# Patient Record
Sex: Male | Born: 1935 | Race: Black or African American | Hispanic: No | Marital: Married | Smoking: Never smoker
Health system: Southern US, Community
[De-identification: ages and names within clinical notes are randomized; demographics above are authoritative.]

## PROBLEM LIST (undated history)

## (undated) DIAGNOSIS — J189 Pneumonia, unspecified organism: Secondary | ICD-10-CM

## (undated) DIAGNOSIS — I318 Other specified diseases of pericardium: Secondary | ICD-10-CM

## (undated) DIAGNOSIS — N451 Epididymitis: Secondary | ICD-10-CM

## (undated) DIAGNOSIS — K08109 Complete loss of teeth, unspecified cause, unspecified class: Secondary | ICD-10-CM

## (undated) DIAGNOSIS — I4891 Unspecified atrial fibrillation: Secondary | ICD-10-CM

## (undated) DIAGNOSIS — E785 Hyperlipidemia, unspecified: Secondary | ICD-10-CM

## (undated) DIAGNOSIS — R251 Tremor, unspecified: Secondary | ICD-10-CM

## (undated) DIAGNOSIS — Z7982 Long term (current) use of aspirin: Secondary | ICD-10-CM

## (undated) DIAGNOSIS — E0500 Thyrotoxicosis with diffuse goiter without thyrotoxic crisis or storm: Secondary | ICD-10-CM

## (undated) DIAGNOSIS — F0280 Dementia in other diseases classified elsewhere without behavioral disturbance: Secondary | ICD-10-CM

## (undated) DIAGNOSIS — I700 Atherosclerosis of aorta: Secondary | ICD-10-CM

## (undated) DIAGNOSIS — N529 Male erectile dysfunction, unspecified: Secondary | ICD-10-CM

## (undated) DIAGNOSIS — I2510 Atherosclerotic heart disease of native coronary artery without angina pectoris: Secondary | ICD-10-CM

## (undated) DIAGNOSIS — E890 Postprocedural hypothyroidism: Secondary | ICD-10-CM

## (undated) DIAGNOSIS — R001 Bradycardia, unspecified: Secondary | ICD-10-CM

## (undated) DIAGNOSIS — R351 Nocturia: Secondary | ICD-10-CM

## (undated) DIAGNOSIS — E43 Unspecified severe protein-calorie malnutrition: Secondary | ICD-10-CM

## (undated) DIAGNOSIS — Z8611 Personal history of tuberculosis: Secondary | ICD-10-CM

## (undated) DIAGNOSIS — I10 Essential (primary) hypertension: Secondary | ICD-10-CM

## (undated) DIAGNOSIS — D61818 Other pancytopenia: Secondary | ICD-10-CM

## (undated) DIAGNOSIS — R600 Localized edema: Secondary | ICD-10-CM

## (undated) DIAGNOSIS — N179 Acute kidney failure, unspecified: Secondary | ICD-10-CM

## (undated) DIAGNOSIS — N400 Enlarged prostate without lower urinary tract symptoms: Secondary | ICD-10-CM

## (undated) DIAGNOSIS — F0390 Unspecified dementia without behavioral disturbance: Secondary | ICD-10-CM

## (undated) DIAGNOSIS — R310 Gross hematuria: Secondary | ICD-10-CM

## (undated) DIAGNOSIS — R972 Elevated prostate specific antigen [PSA]: Secondary | ICD-10-CM

## (undated) DIAGNOSIS — R634 Abnormal weight loss: Secondary | ICD-10-CM

## (undated) DIAGNOSIS — R296 Repeated falls: Secondary | ICD-10-CM

## (undated) DIAGNOSIS — H269 Unspecified cataract: Secondary | ICD-10-CM

## (undated) DIAGNOSIS — H9190 Unspecified hearing loss, unspecified ear: Secondary | ICD-10-CM

## (undated) HISTORY — PX: TOTAL THYROIDECTOMY: SHX2547

## (undated) HISTORY — DX: Benign prostatic hyperplasia without lower urinary tract symptoms: N40.0

## (undated) HISTORY — DX: Epididymitis: N45.1

## (undated) HISTORY — DX: Elevated prostate specific antigen (PSA): R97.20

## (undated) HISTORY — DX: Thyrotoxicosis with diffuse goiter without thyrotoxic crisis or storm: E05.00

## (undated) HISTORY — DX: Essential (primary) hypertension: I10

## (undated) HISTORY — DX: Unspecified dementia, unspecified severity, without behavioral disturbance, psychotic disturbance, mood disturbance, and anxiety: F03.90

## (undated) HISTORY — PX: EYE SURGERY: SHX253

## (undated) HISTORY — DX: Abnormal weight loss: R63.4

## (undated) HISTORY — DX: Atherosclerotic heart disease of native coronary artery without angina pectoris: I25.10

## (undated) HISTORY — DX: Nocturia: R35.1

## (undated) HISTORY — PX: COLONOSCOPY: SHX174

## (undated) HISTORY — DX: Unspecified atrial fibrillation: I48.91

## (undated) HISTORY — DX: Acute kidney failure, unspecified: N17.9

## (undated) HISTORY — DX: Postprocedural hypothyroidism: E89.0

---

## 2006-09-05 ENCOUNTER — Other Ambulatory Visit: Payer: Self-pay

## 2006-09-05 ENCOUNTER — Inpatient Hospital Stay: Payer: Self-pay | Admitting: Internal Medicine

## 2006-10-04 ENCOUNTER — Emergency Department: Payer: Self-pay | Admitting: Emergency Medicine

## 2006-10-04 ENCOUNTER — Other Ambulatory Visit: Payer: Self-pay

## 2006-11-05 ENCOUNTER — Ambulatory Visit: Payer: Self-pay | Admitting: Internal Medicine

## 2007-09-17 ENCOUNTER — Ambulatory Visit: Payer: Self-pay | Admitting: Family Medicine

## 2011-01-30 ENCOUNTER — Inpatient Hospital Stay: Payer: Self-pay | Admitting: Internal Medicine

## 2011-10-08 ENCOUNTER — Ambulatory Visit: Payer: Self-pay | Admitting: Ophthalmology

## 2011-10-08 LAB — CREATININE, SERUM
EGFR (African American): >60
EGFR (Non-African Amer.): >60

## 2011-10-21 DIAGNOSIS — H2510 Age-related nuclear cataract, unspecified eye: Secondary | ICD-10-CM | POA: Insufficient documentation

## 2011-10-21 DIAGNOSIS — E0500 Thyrotoxicosis with diffuse goiter without thyrotoxic crisis or storm: Secondary | ICD-10-CM | POA: Insufficient documentation

## 2011-10-21 DIAGNOSIS — H16109 Unspecified superficial keratitis, unspecified eye: Secondary | ICD-10-CM | POA: Insufficient documentation

## 2011-10-26 ENCOUNTER — Ambulatory Visit: Payer: Self-pay

## 2012-01-02 ENCOUNTER — Ambulatory Visit: Payer: Self-pay | Admitting: Ophthalmology

## 2012-01-02 LAB — CREATININE, SERUM
Creatinine: 1.23 mg/dL (ref 0.60–1.30)
EGFR (African American): >60

## 2012-05-20 ENCOUNTER — Ambulatory Visit: Payer: Self-pay | Admitting: Ophthalmology

## 2012-06-05 ENCOUNTER — Ambulatory Visit: Payer: Self-pay | Admitting: Ophthalmology

## 2012-09-23 ENCOUNTER — Ambulatory Visit: Payer: Self-pay | Admitting: Surgery

## 2012-09-23 LAB — BASIC METABOLIC PANEL
Anion Gap: 4 — ABNORMAL LOW (ref 7–16)
Calcium, Total: 8.7 mg/dL (ref 8.5–10.1)
Chloride: 110 mmol/L — ABNORMAL HIGH (ref 98–107)
EGFR (African American): >60
Glucose: 91 mg/dL (ref 65–99)
Osmolality: 284 (ref 275–301)
Potassium: 4.5 mmol/L (ref 3.5–5.1)
Sodium: 143 mmol/L (ref 136–145)

## 2012-09-25 ENCOUNTER — Ambulatory Visit: Payer: Self-pay | Admitting: Surgery

## 2012-09-26 LAB — CALCIUM: Calcium, Total: 8.7 mg/dL (ref 8.5–10.1)

## 2012-09-26 LAB — ALBUMIN: Albumin: 3.2 g/dL — ABNORMAL LOW (ref 3.4–5.0)

## 2012-09-27 ENCOUNTER — Emergency Department: Payer: Self-pay | Admitting: Internal Medicine

## 2012-09-27 LAB — URINALYSIS, COMPLETE
Bilirubin,UR: NEGATIVE
Ph: 5 (ref 4.5–8.0)
Protein: NEGATIVE
RBC,UR: 36 /HPF (ref 0–5)
Specific Gravity: 1.016 (ref 1.003–1.030)
Squamous Epithelial: 1

## 2012-09-29 LAB — PATHOLOGY REPORT

## 2012-10-07 ENCOUNTER — Ambulatory Visit: Payer: Self-pay | Admitting: Ophthalmology

## 2013-07-06 ENCOUNTER — Ambulatory Visit: Payer: Self-pay | Admitting: Unknown Physician Specialty

## 2013-07-07 LAB — PATHOLOGY REPORT

## 2014-05-03 DIAGNOSIS — I10 Essential (primary) hypertension: Secondary | ICD-10-CM | POA: Insufficient documentation

## 2014-05-03 DIAGNOSIS — I25118 Atherosclerotic heart disease of native coronary artery with other forms of angina pectoris: Secondary | ICD-10-CM | POA: Insufficient documentation

## 2014-05-03 DIAGNOSIS — E785 Hyperlipidemia, unspecified: Secondary | ICD-10-CM | POA: Insufficient documentation

## 2014-05-03 DIAGNOSIS — I2510 Atherosclerotic heart disease of native coronary artery without angina pectoris: Secondary | ICD-10-CM | POA: Insufficient documentation

## 2014-05-19 DIAGNOSIS — E0500 Thyrotoxicosis with diffuse goiter without thyrotoxic crisis or storm: Secondary | ICD-10-CM | POA: Insufficient documentation

## 2014-05-19 DIAGNOSIS — Z8639 Personal history of other endocrine, nutritional and metabolic disease: Secondary | ICD-10-CM | POA: Insufficient documentation

## 2014-05-19 DIAGNOSIS — E079 Disorder of thyroid, unspecified: Secondary | ICD-10-CM | POA: Insufficient documentation

## 2014-11-05 DIAGNOSIS — I10 Essential (primary) hypertension: Secondary | ICD-10-CM | POA: Insufficient documentation

## 2014-11-05 DIAGNOSIS — I2510 Atherosclerotic heart disease of native coronary artery without angina pectoris: Secondary | ICD-10-CM | POA: Insufficient documentation

## 2014-11-05 DIAGNOSIS — E0500 Thyrotoxicosis with diffuse goiter without thyrotoxic crisis or storm: Secondary | ICD-10-CM | POA: Insufficient documentation

## 2014-11-05 DIAGNOSIS — N451 Epididymitis: Secondary | ICD-10-CM | POA: Insufficient documentation

## 2014-11-05 DIAGNOSIS — N179 Acute kidney failure, unspecified: Secondary | ICD-10-CM | POA: Insufficient documentation

## 2014-11-05 DIAGNOSIS — R634 Abnormal weight loss: Secondary | ICD-10-CM | POA: Insufficient documentation

## 2014-11-05 DIAGNOSIS — I4891 Unspecified atrial fibrillation: Secondary | ICD-10-CM | POA: Insufficient documentation

## 2014-11-05 DIAGNOSIS — R972 Elevated prostate specific antigen [PSA]: Secondary | ICD-10-CM | POA: Insufficient documentation

## 2014-11-05 DIAGNOSIS — N400 Enlarged prostate without lower urinary tract symptoms: Secondary | ICD-10-CM | POA: Insufficient documentation

## 2014-11-05 DIAGNOSIS — I480 Paroxysmal atrial fibrillation: Secondary | ICD-10-CM | POA: Insufficient documentation

## 2014-11-05 DIAGNOSIS — E890 Postprocedural hypothyroidism: Secondary | ICD-10-CM | POA: Insufficient documentation

## 2014-11-05 DIAGNOSIS — R351 Nocturia: Secondary | ICD-10-CM | POA: Insufficient documentation
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
## 2014-12-19 ENCOUNTER — Encounter: Payer: Self-pay | Admitting: Urology

## 2014-12-19 DIAGNOSIS — E0500 Thyrotoxicosis with diffuse goiter without thyrotoxic crisis or storm: Secondary | ICD-10-CM | POA: Insufficient documentation

## 2014-12-19 DIAGNOSIS — R351 Nocturia: Secondary | ICD-10-CM

## 2014-12-19 DIAGNOSIS — N400 Enlarged prostate without lower urinary tract symptoms: Secondary | ICD-10-CM

## 2014-12-19 HISTORY — DX: Benign prostatic hyperplasia without lower urinary tract symptoms: N40.0

## 2014-12-19 HISTORY — DX: Nocturia: R35.1

## 2015-01-03 ENCOUNTER — Other Ambulatory Visit: Payer: Self-pay | Admitting: Urology

## 2015-01-03 DIAGNOSIS — R972 Elevated prostate specific antigen [PSA]: Secondary | ICD-10-CM

## 2015-01-04 ENCOUNTER — Ambulatory Visit: Payer: Self-pay | Admitting: Urology

## 2015-01-05 ENCOUNTER — Telehealth: Payer: Self-pay | Admitting: Urology

## 2015-01-05 NOTE — Telephone Encounter (Signed)
Please call the patient to r/s his missed appointment.

## 2015-01-17 ENCOUNTER — Encounter: Payer: Self-pay | Admitting: Family Medicine

## 2015-01-17 NOTE — Telephone Encounter (Signed)
Letter Sent.

## 2015-01-18 ENCOUNTER — Ambulatory Visit: Payer: Self-pay | Admitting: Urology

## 2015-02-09 ENCOUNTER — Ambulatory Visit (INDEPENDENT_AMBULATORY_CARE_PROVIDER_SITE_OTHER): Payer: Self-pay | Admitting: Urology

## 2015-02-09 ENCOUNTER — Encounter: Payer: Self-pay | Admitting: Urology

## 2015-02-09 VITALS — BP 144/75 | HR 56 | Resp 18 | Ht 71.0 in | Wt 199.9 lb

## 2015-02-09 DIAGNOSIS — N528 Other male erectile dysfunction: Secondary | ICD-10-CM

## 2015-02-09 DIAGNOSIS — N401 Enlarged prostate with lower urinary tract symptoms: Secondary | ICD-10-CM

## 2015-02-09 DIAGNOSIS — N138 Other obstructive and reflux uropathy: Secondary | ICD-10-CM

## 2015-02-09 DIAGNOSIS — N529 Male erectile dysfunction, unspecified: Secondary | ICD-10-CM | POA: Insufficient documentation

## 2015-02-09 DIAGNOSIS — N400 Enlarged prostate without lower urinary tract symptoms: Secondary | ICD-10-CM | POA: Insufficient documentation

## 2015-02-09 LAB — BLADDER SCAN AMB NON-IMAGING

## 2015-02-09 MED ORDER — TADALAFIL 20 MG PO TABS: 20.0000 mg | ORAL_TABLET | Freq: Every day | ORAL | Status: DC | PRN

## 2015-02-09 NOTE — Progress Notes (Signed)
02/09/2015 11:07 AM   Tyrone Keith [birth year] [id number]  Referring provider: No referring provider defined for this encounter.  Chief Complaint  Patient presents with  . Follow-up  . Elevated PSA  . Benign Prostatic Hypertrophy    HPI: Tyrone Keith is a 81 year old African American who presents today for a 6 months follow up.  He has massive BPH and his last PSA was 7.59 on 06/23/2014.  This result was obtained by his PCP's office.  For the exception of being enlarged, the prostate exam is normal.  Patient's IPSS score today is 3/3, which is mild symptomatology. He is mixed about his urinary symptoms due to erectile dysfunction.  His PVR today is 39 mL.  He denies any gross hematuria, dysuria or suprapubic pain. He also denies any fevers, chills, nausea or vomiting.      IPSS      02/09/15 1000       International Prostate Symptom Score   How often have you had the sensation of not emptying your bladder? Not at All     How often have you had to urinate less than every two hours? Not at All     How often have you found you stopped and started again several times when you urinated? Not at All     How often have you found it difficult to postpone urination? Less than half the time     How often have you had a weak urinary stream? Not at All     How often have you had to strain to start urination? Not at All     How many times did you typically get up at night to urinate? 1 Time     Total IPSS Score 3     Quality of Life due to urinary symptoms   If you were to spend the rest of your life with your urinary condition just the way it is now how would you feel about that? Mixed        Score:  1-7 Mild 8-19 Moderate 20-35 Severe  Patient states he is able to achieve an erection but it does not last for a significant amount of time. This is preventing him from having satisfactory intercourse. He tried Viagra about 10 years ago and he stated it was ineffective.  He denies any  painful erections or curvature with erections.  PMH: Past Medical History  Diagnosis Date  . Acute kidney failure   . Hypertension   . Epididymitis   . Postoperative primary hypothyroidism   . CAD (coronary artery disease)   . Graves disease   . Atrial fibrillation   . Abnormal weight loss   . Graves disease   . Graves disease   . Elevated PSA   . BPH (benign prostatic hyperplasia) 12/19/2014  . Nocturia 12/19/2014    Surgical History: Past Surgical History  Procedure Laterality Date  . Total thyroidectomy      Home Medications:    Medication List       This list is accurate as of: 02/09/15 11:07 AM.  Always use your most recent med list.               atorvastatin 10 MG tablet  Commonly known as:  LIPITOR  Take by mouth.     carvedilol 3.125 MG tablet  Commonly known as:  COREG  Take by mouth.     levothyroxine 100 MCG tablet  Commonly known as:  SYNTHROID, LEVOTHROID  Take by mouth.     tadalafil 20 MG tablet  Commonly known as:  CIALIS  Take 1 tablet (20 mg total) by mouth daily as needed for erectile dysfunction.        Allergies: No Known Allergies  Family History: Family History  Problem Relation Age of Onset  . Prostate cancer Brother     Social History:  reports that he has never smoked. He does not have any smokeless tobacco history on file. He reports that he does not drink alcohol. His drug history is not on file.  ROS: Urological Symptom Review  Patient is experiencing the following symptoms: Hard to postpone urination Get up at night to urinate   Review of Systems  Gastrointestinal (upper)  : Negative for upper GI symptoms  Gastrointestinal (lower) : Negative for lower GI symptoms  Constitutional : Negative for symptoms  Skin: Negative for skin symptoms  Eyes: Negative for eye symptoms  Ear/Nose/Throat : Negative for Ear/Nose/Throat symptoms  Hematologic/Lymphatic: Negative for Hematologic/Lymphatic  symptoms  Cardiovascular : Negative for cardiovascular symptoms  Respiratory : Negative for respiratory symptoms  Endocrine: Negative for endocrine symptoms  Musculoskeletal: Negative for musculoskeletal symptoms  Neurological: Negative for neurological symptoms  Psychologic: Negative for psychiatric symptoms   Physical Exam: BP 144/75 mmHg  Pulse 56  Resp 18  Ht 5\' 11"  (1.803 m)  Wt 199 lb 14.4 oz (90.674 kg)  BMI 27.89 kg/m2  GU: Patient with uncircumcised phallus. Foreskin easily retracted.  Urethral meatus is patent.  No penile discharge. No penile lesions or rashes. Scrotum without lesions, cysts, rashes and/or edema.  Testicles are located scrotally bilaterally. No masses are appreciated in the testicles. Left and right epididymis are normal.  Patient described what sounds like a communicating hydrocele in the right scrotum. I did not appreciate a hydrocele on today's exam. Rectal: Patient with  normal sphincter tone. Perineum without scarring or rashes. No rectal masses are appreciated. Prostate is approximately 70 grams, no nodules are appreciated. Seminal vesicles are normal.   Laboratory Data: Results for orders placed or performed in visit on 02/09/15  BLADDER SCAN AMB NON-IMAGING  Result Value Ref Range   Scan Result 39ml    No results found for: WBC, HGB, HCT, MCV, PLT  Lab Results  Component Value Date   CREATININE 1.00 09/23/2012    No results found for: PSA  No results found for: TESTOSTERONE  No results found for: HGBA1C  Urinalysis No results found for: COLORURINE, APPEARANCEUR, LABSPEC, PHURINE, GLUCOSEU, HGBUR, BILIRUBINUR, KETONESUR, PROTEINUR, UROBILINOGEN, NITRITE, LEUKOCYTESUR  Pertinent Imaging:   Assessment & Plan:    1. BPH (benign prostatic hyperplasia) with LUTS:   Patient's IPSS score is 3/3. His PVR is 39 mL. He is satisfied with his urinary symptoms.  He will follow-up in one year for IPSS score and PVR.  2. Erectile  dysfunction:   Patient is given a coupon for Cialis.  Cialis 20 mg on-demand dosing is sent to his pharmacy.  He is instructed to take the medication 2 hours prior to intercourse.  He is warned not to take the Cialis with medications that contain nitrates.  I also advised him of the side effects, such as: headache, flushing, dyspepsia, abnormal vision, nasal congestion, back pain, myalgia, nausea, dizziness, and rash. He will report back to the office concerning the results.  3. Elevated PSA:   AUA guidelines discourage PSA screening an individual 11 years of age and older. His last PSA was 7.59 ng/mL on 06/23/2014. The  AUA also recommends increasing the threshold for biopsies in this age group to a PSA of 10. We will continue to monitor the patient with DRE's. If the prostate should present with a nodule, we will discuss a biopsy at that time.  He will present in one year for a DRE.     Return in about 1 year (around 02/09/2016) for IPSS, DRE and PVR.  Michiel CowboySHANNON Brenae Lasecki, PA-C  [facility] [address], [address] [place], [place] [address] [phone]

## 2015-02-22 ENCOUNTER — Emergency Department
Admission: EM | Admit: 2015-02-22 | Discharge: 2015-02-22 | Disposition: A | Discharge status: Home / Self Care | Payer: Medicare Other | Attending: Emergency Medicine | Admitting: Emergency Medicine

## 2015-02-22 ENCOUNTER — Other Ambulatory Visit: Payer: Self-pay

## 2015-02-22 ENCOUNTER — Emergency Department: Payer: Medicare Other

## 2015-02-22 DIAGNOSIS — I10 Essential (primary) hypertension: Secondary | ICD-10-CM | POA: Diagnosis not present

## 2015-02-22 DIAGNOSIS — R51 Headache: Secondary | ICD-10-CM | POA: Insufficient documentation

## 2015-02-22 DIAGNOSIS — R42 Dizziness and giddiness: Secondary | ICD-10-CM | POA: Insufficient documentation

## 2015-02-22 LAB — CBC WITH DIFFERENTIAL/PLATELET
BASOS ABS: 0 10*3/uL (ref 0–0.1)
Basophils Relative: 1 %
EOS PCT: 2 %
Eosinophils Absolute: 0.1 10*3/uL (ref 0–0.7)
HCT: 43.2 % (ref 40.0–52.0)
Hemoglobin: 14.1 g/dL (ref 13.0–18.0)
LYMPHS PCT: 23 %
Lymphs Abs: 1 10*3/uL (ref 1.0–3.6)
MCH: 30.9 pg (ref 26.0–34.0)
MCHC: 32.7 g/dL (ref 32.0–36.0)
MCV: 94.3 fL (ref 80.0–100.0)
MONOS PCT: 9 %
Monocytes Absolute: 0.4 10*3/uL (ref 0.2–1.0)
NEUTROS ABS: 2.9 10*3/uL (ref 1.4–6.5)
NEUTROS PCT: 65 %
Platelets: 149 10*3/uL — ABNORMAL LOW (ref 150–440)
RBC: 4.58 MIL/uL (ref 4.40–5.90)
RDW: 13.9 % (ref 11.5–14.5)
WBC: 4.4 10*3/uL (ref 3.8–10.6)

## 2015-02-22 LAB — COMPREHENSIVE METABOLIC PANEL
ALT: 10 U/L — AB (ref 17–63)
AST: 18 U/L (ref 15–41)
Albumin: 3.6 g/dL (ref 3.5–5.0)
Alkaline Phosphatase: 58 U/L (ref 38–126)
Anion gap: 6 (ref 5–15)
BILIRUBIN TOTAL: 0.8 mg/dL (ref 0.3–1.2)
BUN: 12 mg/dL (ref 6–20)
CO2: 28 mmol/L (ref 22–32)
CREATININE: 1.12 mg/dL (ref 0.61–1.24)
Calcium: 8.8 mg/dL — ABNORMAL LOW (ref 8.9–10.3)
Chloride: 110 mmol/L (ref 101–111)
GFR calc Af Amer: >60 mL/min (ref >60)
Glucose, Bld: 105 mg/dL — ABNORMAL HIGH (ref 65–99)
Potassium: 4.4 mmol/L (ref 3.5–5.1)
Sodium: 144 mmol/L (ref 135–145)
Total Protein: 7 g/dL (ref 6.5–8.1)

## 2015-02-22 LAB — TROPONIN I: Troponin I: <0.03 ng/mL (ref <0.031)

## 2015-02-22 NOTE — Discharge Instructions (Signed)

## 2015-02-22 NOTE — ED Notes (Signed)
Pt c/o HA with nausea and intermittent dizziness(with movement) since yesterday evening..states he took some pain medicine last night but has not had any relief.the patient is not on any blood thinners.Marland Kitchen.denies injury..Marland Kitchen

## 2015-02-22 NOTE — ED Provider Notes (Signed)
[facility] Emergency Department Provider Note     Time seen: ----------------------------------------- 2:58 PM on 02/22/2015 -----------------------------------------    I have reviewed the triage vital signs and the nursing notes.   HISTORY  Chief Complaint Headache; Nausea; and Dizziness    HPI Tyrone Keith is a 81 y.o. male who presents ER for headache and nausea with intermittent dizziness with movement since chest today evening. Wife states he only eats junk food, patient really does not describe his symptoms any more thoroughly than this. He really doesn't complain of any pain currently, he states he generally doesn't feel well with a mild headache that shoots across the frontal part of his head.   Past Medical History  Diagnosis Date  . Acute kidney failure   . Hypertension   . Epididymitis   . Postoperative primary hypothyroidism   . CAD (coronary artery disease)   . Graves disease   . Atrial fibrillation   . Abnormal weight loss   . Graves disease   . Graves disease   . Elevated PSA   . BPH (benign prostatic hyperplasia) 12/19/2014  . Nocturia 12/19/2014    Patient Active Problem List   Diagnosis Date Noted  . 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 BPH (benign prostatic hyperplasia) 02/09/2015  . Graves disease   . Abnormal weight loss 11/05/2014  . Acute kidney failure 11/05/2014  . A-fib 11/05/2014  . Benign fibroma of prostate 11/05/2014  . Arteriosclerosis of coronary artery 11/05/2014  . Abnormal prostate specific antigen 11/05/2014  . Epididymitis 11/05/2014  . 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 BP (high blood pressure) 11/05/2014  . Excessive urination at night 11/05/2014  . Hypothyroidism, postop 11/05/2014  . 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 CAD in native artery 05/03/2014  . Benign essential HTN 05/03/2014  . Cataract, nuclear sclerotic senile 10/21/2011  . Keratitis, superficial 10/21/2011   . 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 Total thyroidectomy    . Eye surgery      Allergies Review of patient's allergies indicates no known allergies.  Social History History  Substance Use Topics  . Smoking status: Never Smoker   . Smokeless tobacco: Never Used  . Alcohol Use: No    Review of Systems Constitutional: Negative for fever. Eyes: Negative for visual changes. ENT: Negative for sore throat. Cardiovascular: Negative for chest pain. Respiratory: Negative for shortness of breath. Gastrointestinal: Negative for abdominal pain, vomiting and diarrhea. Genitourinary: Negative for dysuria. Musculoskeletal: Negative for back pain. Skin: Negative for rash. Neurological: Positive for headache, dizziness  10-point ROS otherwise negative.  ____________________________________________   PHYSICAL EXAM:  VITAL SIGNS: ED Triage Vitals  Enc Vitals Group     BP 02/22/15 1412 140/67 mmHg     Pulse Rate 02/22/15 1412 64     Resp 02/22/15 1412 18     Temp 02/22/15 1412 97.7 F (36.5 C)     Temp Source 02/22/15 1412 Oral     SpO2 02/22/15 1412 96 %     Weight 02/22/15 1412 200 lb (90.719 kg)     Height 02/22/15 1412  (1.803 m)     Head Cir --      Peak Flow --      Pain Score 02/22/15 1413 0     Pain Loc --      Pain Edu? --      Excl. in GC? --     Constitutional: Alert and  oriented. Well appearing and in no distress. Eyes: Conjunctivae are normal. PERRL. Normal extraocular movements. ENT   Head: Normocephalic and atraumatic.   Nose: No congestion/rhinnorhea.   Mouth/Throat: Mucous membranes are moist.   Neck: No stridor. Hematological/Lymphatic/Immunilogical: No cervical lymphadenopathy. Cardiovascular: Normal rate, regular rhythm. Normal and symmetric distal pulses are present in all extremities. No murmurs, rubs, or gallops. Respiratory: Normal respiratory effort without tachypnea nor  retractions. Breath sounds are clear and equal bilaterally. No wheezes/rales/rhonchi. Gastrointestinal: Soft and nontender. No distention. No abdominal bruits. There is no CVA tenderness. Musculoskeletal: Nontender with normal range of motion in all extremities. No joint effusions.  No lower extremity tenderness nor edema. Neurologic:  Normal speech and language. No gross focal neurologic deficits are appreciated. Speech is normal. No gait instability. Skin:  Skin is warm, dry and intact. No rash noted. Psychiatric: Mood and affect are normal. Speech and behavior are normal. Patient exhibits appropriate insight and judgment.  ____________________________________________  ED COURSE:  Pertinent labs & imaging results that were available during my care of the patient were reviewed by me and considered in my medical decision making (see chart for details).  We'll check basic labs, CT head and reevaluate. ____________________________________________    LABS (pertinent positives/negatives)  Labs Reviewed  CBC WITH DIFFERENTIAL/PLATELET - Abnormal; Notable for the following:    Platelets 149 (*)    All other components within normal limits  COMPREHENSIVE METABOLIC PANEL - Abnormal; Notable for the following:    Glucose, Bld 105 (*)    Calcium 8.8 (*)    ALT 10 (*)    All other components within normal limits  TROPONIN I  URINALYSIS COMPLETEWITH MICROSCOPIC ([facility] ONLY)    RADIOLOGY   IMPRESSION: No acute intracranial abnormality. Stable mild cerebral atrophy. Stable periventricular and patchy subcortical chronic white matter disease.  ____________________________________________  FINAL ASSESSMENT AND PLAN  Headache, dizziness  Plan: Patient with labs and imaging as dictated above. Patient has dizziness, no clear etiology. He is encouraged follow-up with his doctor in 2 days for reevaluation.   Emily FilbertWilliams, Jonathan E, MD   Emily FilbertJonathan E Williams, MD 02/22/15 [phone]

## 2016-02-09 ENCOUNTER — Encounter: Payer: Self-pay | Admitting: Urology

## 2016-02-09 ENCOUNTER — Ambulatory Visit: Payer: Medicare Other | Admitting: Urology

## 2016-03-07 ENCOUNTER — Encounter: Payer: Self-pay | Admitting: Urology

## 2016-03-07 ENCOUNTER — Ambulatory Visit (INDEPENDENT_AMBULATORY_CARE_PROVIDER_SITE_OTHER): Payer: Medicare Other | Admitting: Urology

## 2016-03-07 VITALS — BP 125/70 | HR 66 | Ht 71.0 in | Wt 179.8 lb

## 2016-03-07 DIAGNOSIS — N401 Enlarged prostate with lower urinary tract symptoms: Secondary | ICD-10-CM

## 2016-03-07 DIAGNOSIS — R972 Elevated prostate specific antigen [PSA]: Secondary | ICD-10-CM

## 2016-03-07 DIAGNOSIS — N138 Other obstructive and reflux uropathy: Secondary | ICD-10-CM

## 2016-03-07 LAB — BLADDER SCAN AMB NON-IMAGING: Scan Result: 33

## 2016-03-07 NOTE — Progress Notes (Signed)
03/07/2016 4:07 PM   Tyrone Keith 01-Jan-1933 [id number]  Referring provider: Kandyce Rud, MD [phone] Tyrone Keith [facility] [place], [place] [address]  Chief Complaint  Patient presents with  . Follow-up    HPI: [name] is a 82 year old African American who presents today for a 6 months follow up.  He has massive BPH and his last PSA was 7.59 on 06/23/2014.  This result was obtained by his PCP's office.  For the exception of being enlarged, the prostate exam is normal.  Patient's IPSS score today is 4, which is mild symptomatology. He is delighted about his urinary symptoms.  His previous IPSS score was 3/3.  His previous PVR today is 39 mL.  He denies any gross hematuria, dysuria or suprapubic pain. He also denies any fevers, chills, nausea or vomiting.      IPSS    Row Name 03/07/16 1500         International Prostate Symptom Score   How often have you had the sensation of not emptying your bladder? Not at All     How often have you had to urinate less than every two hours? Not at All     How often have you found you stopped and started again several times when you urinated? Less than 1 in 5 times     How often have you found it difficult to postpone urination? Less than 1 in 5 times     How often have you had a weak urinary stream? Less than 1 in 5 times     How often have you had to strain to start urination? Not at All     How many times did you typically get up at night to urinate? 1 Time     Total IPSS Score 4       Quality of Life due to urinary symptoms   If you were to spend the rest of your life with your urinary condition just the way it is now how would you feel about that? Delighted        Score:  1-7 Mild 8-19 Moderate 20-35 Severe   PMH: Past Medical History:  Diagnosis Date  . Abnormal weight loss   . Acute kidney failure (HCC)   . Atrial fibrillation (HCC)   . BPH (benign prostatic hyperplasia) 12/19/2014    . CAD (coronary artery disease)   . Elevated PSA   . Epididymitis   . Graves disease   . Graves disease   . Graves disease   . Hypertension   . Nocturia 12/19/2014  . Postoperative primary hypothyroidism     Surgical History: Past Surgical History:  Procedure Laterality Date  . EYE SURGERY    . TOTAL THYROIDECTOMY      Home Medications:    Medication List       Accurate as of 03/07/16  4:07 PM. Always use your most recent med list.          acetaminophen 325 MG tablet Commonly known as:  TYLENOL Take 975 mg by mouth every 6 (six) hours as needed for mild pain or headache.   atorvastatin 10 MG tablet Commonly known as:  LIPITOR Take 10 mg by mouth daily.   carvedilol 3.125 MG tablet Commonly known as:  COREG Take 3.125 mg by mouth 2 (two) times daily.   levothyroxine 100 MCG tablet Commonly known as:  SYNTHROID, LEVOTHROID Take 100 mcg by mouth daily.  tadalafil 20 MG tablet Commonly known as:  CIALIS Take 1 tablet (20 mg total) by mouth daily as needed for erectile dysfunction.       Allergies: No Known Allergies  Family History: Family History  Problem Relation Age of Onset  . Prostate cancer Brother   . Bladder Cancer Neg Hx     Social History:  reports that he has never smoked. He has never used smokeless tobacco. He reports that he does not drink alcohol or use drugs.  ROS: Urological Symptom Review  Patient is experiencing the following symptoms: Hard to postpone urination Get up at night to urinate   Review of Systems  Gastrointestinal (upper)  : Negative for upper GI symptoms  Gastrointestinal (lower) : Negative for lower GI symptoms  Constitutional : Negative for symptoms  Skin: Negative for skin symptoms  Eyes: Negative for eye symptoms  Ear/Nose/Throat : Negative for Ear/Nose/Throat symptoms  Hematologic/Lymphatic: Negative for Hematologic/Lymphatic symptoms  Cardiovascular : Negative for cardiovascular  symptoms  Respiratory : Negative for respiratory symptoms  Endocrine: Negative for endocrine symptoms  Musculoskeletal: Negative for musculoskeletal symptoms  Neurological: Negative for neurological symptoms  Psychologic: Negative for psychiatric symptoms   Physical Exam: BP 125/70 (BP Location: Left Arm, Patient Position: Sitting, Cuff Size: Normal)   Pulse 66   Ht  (1.803 m)   Wt 179 lb 12.8 oz (81.6 kg)   BMI 25.08 kg/m   GU: Patient with uncircumcised phallus. Foreskin easily retracted.  Urethral meatus is patent.  No penile discharge. No penile lesions or rashes. Scrotum without lesions, cysts, rashes and/or edema.  Testicles are located scrotally bilaterally. No masses are appreciated in the testicles. Left and right epididymis are normal.  Patient described what sounds like a communicating hydrocele in the right scrotum. I did not appreciate a hydrocele on today's exam. Rectal: Patient with  normal sphincter tone. Perineum without scarring or rashes. No rectal masses are appreciated. Prostate is approximately 70 grams, no nodules are appreciated. Seminal vesicles are normal.   Laboratory Data: Results for orders placed or performed in visit on 03/07/16  Bladder Scan (Post Void Residual) in office  Result Value Ref Range   Scan Result 33    Lab Results  Component Value Date   WBC 4.4 02/22/2015   HGB 14.1 02/22/2015   HCT 43.2 02/22/2015   MCV 94.3 02/22/2015   PLT 149 (L) 02/22/2015    Lab Results  Component Value Date   CREATININE 1.12 02/22/2015     Urinalysis  Pertinent Imaging: Results for Tyrone, Keith (MRN [id number]) as of 03/07/2016 15:54  Ref. Range 03/07/2016 15:42  Scan Result Unknown 33    Assessment & Plan:    1. BPH (benign prostatic hyperplasia) with LUTS:   Patient's IPSS score is 4/0.  His PVR is 33 mL. He is satisfied with his urinary symptoms.  He will follow-up in one year for IPSS score and PVR.  2. Erectile dysfunction:    Patient is not interested in sexually activity.  3. Elevated PSA:   AUA guidelines discourage PSA screening an individual 12 years of age and older. His last PSA was 7.59 ng/mL on 06/23/2014. The AUA also recommends increasing the threshold for biopsies in this age group to a PSA of 10. We will continue to monitor the patient with DRE's. If the prostate should present with a nodule, we will discuss a biopsy at that time.  He will present in one year for a DRE.  Return in about 1 year (around 03/07/2017) for IPSS and exam.  Michiel Cowboy, [facility] [address], [address] [place], [place] [address] [phone]

## 2017-03-05 NOTE — Progress Notes (Deleted)
03/06/2017 9:10 PM   Tyrone Keith Bob Aug 19, 1932 [id number]  Referring provider: Kandyce RudBabaoff, Marcus, MD [phone] [address] [facility] [place] [facility] [place], [place] [address]  No chief complaint on file.   HPI: Tyrone Keith is a 83 year old African American male who presents today for a 12 months follow up.    BPH with LU TS Patient's IPSS score today is ***, which is *** symptomatology. He is *** about his urinary symptoms.  His previous IPSS score was 4/0.  His previous PVR today is 39 mL.  His major complaint(s) today is/are ***.   This has been occurring for ***.  He denies any gross hematuria, dysuria or suprapubic pain. He also denies any fevers, chills, nausea or vomiting.    Score:  1-7 Mild 8-19 Moderate 20-35 Severe  History of elevated PSA He has massive BPH and his last PSA was 7.59 on 06/23/2014.  This result was obtained by his PCP's office.  For the exception of being enlarged, the prostate exam is normal.  Erectile dysfunction Not sexually active at this time.   PMH: Past Medical History:  Diagnosis Date  . Abnormal weight loss   . Acute kidney failure (HCC)   . Atrial fibrillation (HCC)   . BPH (benign prostatic hyperplasia) 12/19/2014  . CAD (coronary artery disease)   . Elevated PSA   . Epididymitis   . Graves disease   . Graves disease   . Graves disease   . Hypertension   . Nocturia 12/19/2014  . Postoperative primary hypothyroidism     Surgical History: Past Surgical History:  Procedure Laterality Date  . EYE SURGERY    . TOTAL THYROIDECTOMY      Home Medications:  Allergies as of 03/06/2017   No Known Allergies     Medication List       Accurate as of 03/05/17  9:10 PM. Always use your most recent med list.          acetaminophen 325 MG tablet Commonly known as:  TYLENOL Take 975 mg by mouth every 6 (six) hours as needed for mild pain or headache.   atorvastatin 10 MG tablet Commonly known as:  LIPITOR Take 10  mg by mouth daily.   carvedilol 3.125 MG tablet Commonly known as:  COREG Take 3.125 mg by mouth 2 (two) times daily.   levothyroxine 100 MCG tablet Commonly known as:  SYNTHROID, LEVOTHROID Take 100 mcg by mouth daily.   tadalafil 20 MG tablet Commonly known as:  CIALIS Take 1 tablet (20 mg total) by mouth daily as needed for erectile dysfunction.       Allergies: No Known Allergies  Family History: Family History  Problem Relation Age of Onset  . Prostate cancer Brother   . Bladder Cancer Neg Hx     Social History:  reports that he has never smoked. He has never used smokeless tobacco. He reports that he does not drink alcohol or use drugs.  ROS:       Physical Exam: There were no vitals taken for this visit.  Constitutional: Well nourished. Alert and oriented, No acute distress. HEENT: [place] AT, moist mucus membranes. Trachea midline, no masses. Cardiovascular: No clubbing, cyanosis, or edema. Respiratory: Normal respiratory effort, no increased work of breathing. GI: Abdomen is soft, non tender, non distended, no abdominal masses. Liver and spleen not palpable.  No hernias appreciated.  Stool sample for occult testing is not indicated.   GU: No CVA  tenderness.  No bladder fullness or masses.  Patient with circumcised/uncircumcised phallus. ***Foreskin easily retracted***  Urethral meatus is patent.  No penile discharge. No penile lesions or rashes. Scrotum without lesions, cysts, rashes and/or edema.  Testicles are located scrotally bilaterally. No masses are appreciated in the testicles. Left and right epididymis are normal. Rectal: Patient with  normal sphincter tone. Anus and perineum without scarring or rashes. No rectal masses are appreciated. Prostate is approximately *** grams, *** nodules are appreciated. Seminal vesicles are normal. Skin: No rashes, bruises or suspicious lesions. Lymph: No cervical or inguinal adenopathy. Neurologic: Grossly intact, no focal  deficits, moving all 4 extremities. Psychiatric: Normal mood and affect.  Laboratory Data:  Urinalysis  Pertinent Imaging:    Assessment & Plan:    1. BPH (benign prostatic hyperplasia) with LUTS:   Patient's IPSS score is 4/0.  His PVR is 33 mL. He is satisfied with his urinary symptoms.  He will follow-up in one year for IPSS score and PVR.  2. Erectile dysfunction:   Patient is not interested in sexually activity.  3. Elevated PSA:   AUA guidelines discourage PSA screening an individual 81 years of age and older. His last PSA was 7.59 ng/mL on 06/23/2014. The AUA also recommends increasing the threshold for biopsies in this age group to a PSA of 10. We will continue to monitor the patient with DRE's. If the prostate should present with a nodule, we will discuss a biopsy at that time.  He will present in one year for a DRE.     No Follow-up on file.  Michiel CowboySHANNON Feliciano Wynter, PA-C  [facility] [address], [address] [place], [place] [address] [phone]

## 2017-03-06 ENCOUNTER — Encounter: Payer: Self-pay | Admitting: Urology

## 2017-03-06 ENCOUNTER — Ambulatory Visit: Payer: Medicare Other | Admitting: Urology

## 2017-03-10 ENCOUNTER — Telehealth: Payer: Self-pay | Admitting: Urology

## 2017-03-10 NOTE — Telephone Encounter (Signed)
Patient called and left a message on the voicemail 03-10-17 did not say what he wanted but he did miss his app with Carollee HerterShannon on 03-07-17 and so I left him a message to call back to reschedule it. If he calls back he just needs to reschedule his app.  Thanks [employer]

## 2017-03-23 NOTE — Progress Notes (Signed)
03/24/2017 3:01 PM   Tyrone Keith Jun 02, 1933 [id number]  Referring provider: Kandyce Rud, MD [phone] [address] [facility] - Family and Internal Medicine [place], [place] [address]  Chief Complaint  Patient presents with  . Benign Prostatic Hypertrophy    1 year follow up  . Elevated PSA    HPI: Tyrone Keith is a 83 year old African American male who presents today for a 12 months follow up.    BPH with LU TS Patient's IPSS score today is 3, which is mild symptomatology. He is pleased about his urinary symptoms.  His previous IPSS score was 4/0.  His previous PVR was 39 mL.  His major complaint(s) today is/are nocturia x 1.   This has been occurring for several years.  He denies any gross hematuria, dysuria or suprapubic pain. He also denies any fevers, chills, nausea or vomiting.      IPSS    Row Name 03/24/17 1400         International Prostate Symptom Score   How often have you had the sensation of not emptying your bladder? Less than 1 in 5     How often have you had to urinate less than every two hours? Not at All     How often have you found you stopped and started again several times when you urinated? Not at All     How often have you found it difficult to postpone urination? Less than 1 in 5 times     How often have you had a weak urinary stream? Not at All     How often have you had to strain to start urination? Not at All     How many times did you typically get up at night to urinate? 1 Time     Total IPSS Score 3       Quality of Life due to urinary symptoms   If you were to spend the rest of your life with your urinary condition just the way it is now how would you feel about that? Pleased        Score:  1-7 Mild 8-19 Moderate 20-35 Severe  History of elevated PSA He has massive BPH and his last PSA was 7.59 on 06/23/2014.  This result was obtained by his PCP's office.  For the exception of being enlarged, the prostate exam is normal.  Erectile  dysfunction Not sexually active at this time.   PMH: Past Medical History:  Diagnosis Date  . Abnormal weight loss   . Acute kidney failure (HCC)   . Atrial fibrillation (HCC)   . BPH (benign prostatic hyperplasia) 12/19/2014  . CAD (coronary artery disease)   . Elevated PSA   . Epididymitis   . Graves disease   . Graves disease   . Graves disease   . Hypertension   . Nocturia 12/19/2014  . Postoperative primary hypothyroidism     Surgical History: Past Surgical History:  Procedure Laterality Date  . EYE SURGERY    . TOTAL THYROIDECTOMY      Home Medications:  Allergies as of 03/24/2017   No Known Allergies     Medication List       Accurate as of 03/24/17  3:01 PM. Always use your most recent med list.          acetaminophen 325 MG tablet Commonly known as:  TYLENOL Take 975 mg by mouth every 6 (six) hours as needed for mild pain or headache.  atorvastatin 10 MG tablet Commonly known as:  LIPITOR Take 10 mg by mouth daily.   carvedilol 3.125 MG tablet Commonly known as:  COREG Take 3.125 mg by mouth 2 (two) times daily.   levothyroxine 100 MCG tablet Commonly known as:  SYNTHROID, LEVOTHROID Take 100 mcg by mouth daily.   tadalafil 20 MG tablet Commonly known as:  CIALIS Take 1 tablet (20 mg total) by mouth daily as needed for erectile dysfunction.       Allergies: No Known Allergies  Family History: Family History  Problem Relation Age of Onset  . Prostate cancer Brother   . Bladder Cancer Neg Hx   . Kidney cancer Neg Hx     Social History:  reports that he has never smoked. He has never used smokeless tobacco. He reports that he does not drink alcohol or use drugs.  ROS: UROLOGY Frequent Urination?: No Hard to postpone urination?: No Burning/pain with urination?: No Get up at night to urinate?: No Leakage of urine?: No Urine stream starts and stops?: No Trouble starting stream?: No Do you have to strain to urinate?: No Blood in  urine?: No Urinary tract infection?: No Sexually transmitted disease?: No Injury to kidneys or bladder?: No Painful intercourse?: No Weak stream?: No Erection problems?: No Penile pain?: No Gastrointestinal Nausea?: No Vomiting?: No Indigestion/heartburn?: No Diarrhea?: No Constipation?: No Constitutional Fever: No Night sweats?: No Weight loss?: No Fatigue?: No Skin Skin rash/lesions?: No Itching?: No Eyes Blurred vision?: No Double vision?: No Ears/Nose/Throat Sore throat?: No Sinus problems?: No Hematologic/Lymphatic Swollen glands?: No Easy bruising?: No Cardiovascular Leg swelling?: No Chest pain?: No Respiratory Cough?: No Shortness of breath?: No Endocrine Excessive thirst?: No Musculoskeletal Back pain?: No Joint pain?: No Neurological Headaches?: No Dizziness?: No Psychologic Depression?: No Anxiety?: No     Physical Exam: BP 135/72   Pulse 66   Ht 5\' 10"  (1.778 m)   Wt 177 lb 1.6 oz (80.3 kg)   BMI 25.41 kg/m   Constitutional: Well nourished. Alert and oriented, No acute distress. HEENT: [place] AT, moist mucus membranes. Trachea midline, no masses. Cardiovascular: No clubbing, cyanosis, or edema. Respiratory: Normal respiratory effort, no increased work of breathing. GI: Abdomen is soft, non tender, non distended, no abdominal masses. Liver and spleen not palpable.  No hernias appreciated.  Stool sample for occult testing is not indicated.   GU: No CVA tenderness.  No bladder fullness or masses.  Patient with uncircumcised phallus. Foreskin easily retracted  Urethral meatus is patent.  No penile discharge. No penile lesions or rashes. Scrotum without lesions, cysts, rashes and/or edema.  Testicles are located scrotally bilaterally. No masses are appreciated in the testicles. Left and right epididymis are normal. Rectal: Patient with  normal sphincter tone. Anus and perineum without scarring or rashes. No rectal masses are appreciated. Prostate  is approximately 70 grams, no nodules are appreciated. Seminal vesicles are normal. Skin: No rashes, bruises or suspicious lesions. Lymph: No cervical or inguinal adenopathy. Neurologic: Grossly intact, no focal deficits, moving all 4 extremities. Psychiatric: Normal mood and affect.  Laboratory Data:  Assessment & Plan:    1. BPH (benign prostatic hyperplasia) with LUTS:   Patient's IPSS score is 3/1.  He is pleased with his urinary symptoms.  He will follow-up in one year for IPSS score and PVR.  2. Erectile dysfunction:   Patient is not interested in sexually activity.  3. Elevated PSA:   AUA guidelines discourage PSA screening an individual 39 years of age  and older. His last PSA was 7.59 ng/mL on 06/23/2014. The AUA also recommends increasing the threshold for biopsies in this age group to a PSA of 10. We will continue to monitor the patient with DRE's. If the prostate should present with a nodule, we will discuss a biopsy at that time.  He will present in one year for a DRE.     Return in about 1 week (around 03/31/2017) for I PSS and exam.  Michiel Cowboy, [facility] [address] [place], [place] [address] [phone]

## 2017-03-24 ENCOUNTER — Ambulatory Visit (INDEPENDENT_AMBULATORY_CARE_PROVIDER_SITE_OTHER): Payer: Medicare Other | Admitting: Urology

## 2017-03-24 ENCOUNTER — Encounter: Payer: Self-pay | Admitting: Urology

## 2017-03-24 VITALS — BP 135/72 | HR 66 | Ht 70.0 in | Wt 177.1 lb

## 2017-03-24 DIAGNOSIS — R972 Elevated prostate specific antigen [PSA]: Secondary | ICD-10-CM | POA: Diagnosis not present

## 2017-03-24 DIAGNOSIS — N529 Male erectile dysfunction, unspecified: Secondary | ICD-10-CM | POA: Diagnosis not present

## 2017-03-24 DIAGNOSIS — N401 Enlarged prostate with lower urinary tract symptoms: Secondary | ICD-10-CM | POA: Diagnosis not present

## 2017-03-24 DIAGNOSIS — N138 Other obstructive and reflux uropathy: Secondary | ICD-10-CM

## 2018-03-24 NOTE — Progress Notes (Addendum)
03/25/2018 2:32 PM   Tyrone Keith 12-Apr-1933 [id number]  Referring provider: Kandyce RudBabaoff, Marcus, MD [phone] [address] [facility] [place] [facility] [place], [place] [address]  No chief complaint on file.   HPI: [name] is a 84 year old African American male who presents today for a 12 months follow up.    BPH with LU TS Patient's IPSS score today is 4, which is mild symptomatology.  He is pleased about his urinary symptoms.  His previous IPSS score was 4/0.  His previous PVR was 39 mL.  His major complaint(s) today is/are nocturia x 1.   This has been occurring for several years.  He denies any gross hematuria, dysuria or suprapubic pain. He also denies any fevers, chills, nausea or vomiting. IPSS    Row Name 03/25/18 1400         International Prostate Symptom Score   How often have you had the sensation of not emptying your bladder?  Not at All     How often have you had to urinate less than every two hours?  Less than 1 in 5 times     How often have you found you stopped and started again several times when you urinated?  Less than half the time     How often have you found it difficult to postpone urination?  Not at All     How often have you had a weak urinary stream?  Not at All     How often have you had to strain to start urination?  Not at All     How many times did you typically get up at night to urinate?  1 Time     Total IPSS Score  4       Quality of Life due to urinary symptoms   If you were to spend the rest of your life with your urinary condition just the way it is now how would you feel about that?  Pleased        Score:  1-7 Mild 8-19 Moderate 20-35 Severe   PMH: Past Medical History:  Diagnosis Date  . Abnormal weight loss   . Acute kidney failure (HCC)   . Atrial fibrillation (HCC)   . BPH (benign prostatic hyperplasia) 12/19/2014  . CAD (coronary artery disease)   . Elevated PSA   . Epididymitis   . Graves disease   .  Graves disease   . Graves disease   . Hypertension   . Nocturia 12/19/2014  . Postoperative primary hypothyroidism     Surgical History: Past Surgical History:  Procedure Laterality Date  . EYE SURGERY    . TOTAL THYROIDECTOMY      Home Medications:  Allergies as of 03/25/2018   No Known Allergies     Medication List        Accurate as of 03/25/18  2:32 PM. Always use your most recent med list.          acetaminophen 325 MG tablet Commonly known as:  TYLENOL Take 975 mg by mouth every 6 (six) hours as needed for mild pain or headache.   atorvastatin 10 MG tablet Commonly known as:  LIPITOR Take 10 mg by mouth daily.   carvedilol 3.125 MG tablet Commonly known as:  COREG Take 3.125 mg by mouth 2 (two) times daily.   levothyroxine 100 MCG tablet Commonly known as:  SYNTHROID, LEVOTHROID Take 100 mcg by mouth daily.  Allergies: No Known Allergies  Family History: Family History  Problem Relation Age of Onset  . Prostate cancer Brother   . Bladder Cancer Neg Hx   . Kidney cancer Neg Hx     Social History:  reports that he has never smoked. He has never used smokeless tobacco. He reports that he does not drink alcohol or use drugs.  ROS: UROLOGY Frequent Urination?: Yes Hard to postpone urination?: No Burning/pain with urination?: No Get up at night to urinate?: Yes Leakage of urine?: No Urine stream starts and stops?: No Trouble starting stream?: No Do you have to strain to urinate?: No Blood in urine?: No Urinary tract infection?: No Sexually transmitted disease?: No Injury to kidneys or bladder?: No Painful intercourse?: No Weak stream?: No Erection problems?: Yes Penile pain?: No Gastrointestinal Nausea?: No Vomiting?: No Indigestion/heartburn?: No Diarrhea?: No Constipation?: No Constitutional Fever: No Night sweats?: No Weight loss?: Yes Fatigue?: No Skin Skin rash/lesions?: No Itching?: No Eyes Blurred vision?: No Double  vision?: No Ears/Nose/Throat Sore throat?: No Sinus problems?: No Hematologic/Lymphatic Swollen glands?: No Easy bruising?: No Cardiovascular Leg swelling?: No Chest pain?: No Respiratory Cough?: No Shortness of breath?: No Endocrine Excessive thirst?: No Musculoskeletal Back pain?: No Joint pain?: No Neurological Headaches?: No Dizziness?: No Psychologic Depression?: No Anxiety?: No     Physical Exam: BP (!) 147/72 (BP Location: Left Arm, Patient Position: Sitting, Cuff Size: Normal)   Pulse 69   Ht 5\' 10"  (1.778 m)   Wt 166 lb 9.6 oz (75.6 kg)   BMI 23.90 kg/m   Constitutional: Well nourished. Alert and oriented, No acute distress. HEENT: [place] AT, moist mucus membranes. Trachea midline, no masses. Poor dentition.  Cardiovascular: No clubbing, cyanosis, or edema. Respiratory: Normal respiratory effort, no increased work of breathing. GI: Abdomen is soft, non tender, non distended, no abdominal masses. Liver and spleen not palpable.  No hernias appreciated.  Stool sample for occult testing is not indicated.   GU: No CVA tenderness.  No bladder fullness or masses.  Patient with uncircumcised phallus.  Foreskin easily retracted.   Urethral meatus is patent.  No penile discharge. No penile lesions or rashes. Scrotum without lesions, cysts, rashes and/or edema.  Testicles are located scrotally bilaterally. No masses are appreciated in the testicles. Left and right epididymis are normal. Rectal: Patient with  normal sphincter tone. Anus and perineum without scarring or rashes. No rectal masses are appreciated. Prostate is approximately 70 grams, could only palpated the apex and midportion, no nodules are appreciated. Skin: No rashes, bruises or suspicious lesions. Lymph: No cervical or inguinal adenopathy. Neurologic: Grossly intact, no focal deficits, moving all 4 extremities. Psychiatric: Normal mood and affect.   Laboratory Data:  Assessment & Plan:    1. BPH (benign  prostatic hyperplasia) with LUTS:   Patient's IPSS score is 4/1.  He is pleased with his urinary symptoms.  He will follow-up in one year for IPSS score and PVR.    Return in about 1 year (around 03/26/2019) for I PSS and exam .  Michiel CowboySHANNON Jarelyn Bambach, [facility]-C  [facility] [address] [place], [place] [address] [phone]

## 2018-03-25 ENCOUNTER — Ambulatory Visit (INDEPENDENT_AMBULATORY_CARE_PROVIDER_SITE_OTHER): Payer: Medicare Other | Admitting: Urology

## 2018-03-25 ENCOUNTER — Encounter: Payer: Self-pay | Admitting: Urology

## 2018-03-25 VITALS — BP 147/72 | HR 69 | Ht 70.0 in | Wt 166.6 lb

## 2018-03-25 DIAGNOSIS — N401 Enlarged prostate with lower urinary tract symptoms: Secondary | ICD-10-CM | POA: Diagnosis not present

## 2018-03-25 DIAGNOSIS — N138 Other obstructive and reflux uropathy: Secondary | ICD-10-CM | POA: Diagnosis not present

## 2018-12-11 ENCOUNTER — Emergency Department: Payer: Medicare Other

## 2018-12-11 ENCOUNTER — Other Ambulatory Visit: Payer: Self-pay

## 2018-12-11 ENCOUNTER — Emergency Department
Admission: EM | Admit: 2018-12-11 | Discharge: 2018-12-11 | Disposition: A | Discharge status: Home / Self Care | Payer: Medicare Other | Attending: Emergency Medicine | Admitting: Emergency Medicine

## 2018-12-11 ENCOUNTER — Encounter: Payer: Self-pay | Admitting: Emergency Medicine

## 2018-12-11 DIAGNOSIS — I10 Essential (primary) hypertension: Secondary | ICD-10-CM | POA: Insufficient documentation

## 2018-12-11 DIAGNOSIS — I2510 Atherosclerotic heart disease of native coronary artery without angina pectoris: Secondary | ICD-10-CM | POA: Insufficient documentation

## 2018-12-11 DIAGNOSIS — R310 Gross hematuria: Secondary | ICD-10-CM | POA: Insufficient documentation

## 2018-12-11 DIAGNOSIS — E890 Postprocedural hypothyroidism: Secondary | ICD-10-CM | POA: Insufficient documentation

## 2018-12-11 DIAGNOSIS — Z79899 Other long term (current) drug therapy: Secondary | ICD-10-CM | POA: Diagnosis not present

## 2018-12-11 DIAGNOSIS — R319 Hematuria, unspecified: Secondary | ICD-10-CM | POA: Diagnosis present

## 2018-12-11 LAB — BASIC METABOLIC PANEL
Anion gap: 6 (ref 5–15)
BUN: 16 mg/dL (ref 8–23)
CO2: 29 mmol/L (ref 22–32)
Calcium: 8.5 mg/dL — ABNORMAL LOW (ref 8.9–10.3)
Chloride: 107 mmol/L (ref 98–111)
Creatinine, Ser: 1.09 mg/dL (ref 0.61–1.24)
GFR calc Af Amer: >60 mL/min (ref >60)
GFR calc non Af Amer: >60 mL/min (ref >60)
Glucose, Bld: 86 mg/dL (ref 70–99)
Potassium: 4.8 mmol/L (ref 3.5–5.1)
Sodium: 142 mmol/L (ref 135–145)

## 2018-12-11 LAB — CBC
HCT: 41.2 % (ref 39.0–52.0)
Hemoglobin: 12.9 g/dL — ABNORMAL LOW (ref 13.0–17.0)
MCH: 30.1 pg (ref 26.0–34.0)
MCHC: 31.3 g/dL (ref 30.0–36.0)
MCV: 96.3 fL (ref 80.0–100.0)
Platelets: 153 10*3/uL (ref 150–400)
RBC: 4.28 MIL/uL (ref 4.22–5.81)
RDW: 13.8 % (ref 11.5–15.5)
WBC: 3.3 10*3/uL — ABNORMAL LOW (ref 4.0–10.5)
nRBC: 0 % (ref 0.0–0.2)

## 2018-12-11 LAB — URINALYSIS, COMPLETE (UACMP) WITH MICROSCOPIC
Bacteria, UA: NONE SEEN
RBC / HPF: >50 RBC/hpf — ABNORMAL HIGH (ref 0–5)
Specific Gravity, Urine: 1.023 (ref 1.005–1.030)
Squamous Epithelial / LPF: NONE SEEN (ref 0–5)
WBC, UA: NONE SEEN WBC/hpf (ref 0–5)

## 2018-12-11 LAB — APTT: aPTT: 36 seconds (ref 24–36)

## 2018-12-11 LAB — PROTIME-INR
INR: 1.1 (ref 0.8–1.2)
Prothrombin Time: 14 seconds (ref 11.4–15.2)

## 2018-12-11 MED ORDER — CEPHALEXIN 500 MG PO CAPS: 500.0000 mg | ORAL_CAPSULE | Freq: Three times a day (TID) | ORAL | 0 refills | Status: AC

## 2018-12-11 MED ORDER — CEPHALEXIN 500 MG PO CAPS
500.0000 mg | ORAL_CAPSULE | Freq: Once | ORAL | Status: AC
  Administered 2018-12-11: 500 mg via ORAL
  Filled 2018-12-11: qty 1

## 2018-12-11 NOTE — ED Provider Notes (Signed)
[facility] Emergency Department Provider Note  ____________________________________________  Time seen: Approximately 10:29 PM  I have reviewed the triage vital signs and the nursing notes.   HISTORY  Chief Complaint Hematuria   HPI Tyrone Keith is a 85 y.o. male with a history of BPH, CAD, hypertension, nocturia who presents for evaluation of gross hematuria.  Symptoms started this evening at [facility]. Had 2 episodes so far.  Patient is passing clots and red wine colored urine.  No dysuria, frequency, abdominal pain or flank pain, no dizziness, chest pain or shortness of breath.  Patient is not on blood thinners.  He denies ever having similar symptoms.  He feels like he is able to fully empty his bladder.  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   BPH (benign prostatic hyperplasia) 02/09/2015   Graves disease    Abnormal weight loss 11/05/2014   Acute kidney failure (HCC) 11/05/2014   A-fib (HCC) 11/05/2014   Benign fibroma of prostate 11/05/2014   Arteriosclerosis of coronary artery 11/05/2014   Abnormal prostate specific antigen 11/05/2014   Epididymitis 11/05/2014   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   BP (high blood pressure) 11/05/2014   Excessive urination at night 11/05/2014   Hypothyroidism, postop 11/05/2014   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 Authorizing Provider  acetaminophen (TYLENOL) 325 MG tablet Take 975 mg by mouth every 6 (six) hours as needed for mild pain or headache.    [provider]  atorvastatin (LIPITOR) 10 MG tablet Take 10 mg by mouth daily.    [provider]  carvedilol (COREG) 3.125 MG tablet Take 3.125 mg by mouth 2 (two) times daily.    [provider]  cephALEXin (KEFLEX) 500 MG capsule Take 1 capsule (500 mg total) by mouth 3 (three) times daily for 7 days. 12/11/18 12/18/18  Nita Sickle, MD  levothyroxine (SYNTHROID, LEVOTHROID) 100 MCG tablet Take 100 mcg by mouth daily.    [provider]    Allergies Patient has no known allergies.  Family History  Problem Relation Age of Onset   Prostate cancer Brother    Bladder Cancer Neg Hx    Kidney cancer Neg Hx     Social History Social History   Tobacco Use   Smoking status: Never Smoker   Smokeless tobacco: Never Used  Substance Use Topics   Alcohol use: No    Alcohol/week: 0.0 standard drinks   Drug use: No    Review of Systems  Constitutional: Negative for fever. Eyes: Negative for visual changes. ENT: Negative for sore throat. Neck: No neck pain  Cardiovascular: Negative for chest pain. Respiratory: Negative for shortness of breath. Gastrointestinal: Negative for abdominal pain, vomiting or diarrhea. Genitourinary: Negative for dysuria. + hematuria Musculoskeletal: Negative for back pain. Skin: Negative for rash. Neurological: Negative for headaches, weakness or numbness. Psych: No SI or HI  ____________________________________________   PHYSICAL EXAM:  VITAL SIGNS: ED Triage Vitals  Enc Vitals Group     BP 12/11/18 2003 (!) 159/79      Pulse Rate 12/11/18 2003 63     Resp 12/11/18 2003 18     Temp 12/11/18 2003 97.9 F (36.6 C)     Temp Source 12/11/18 2003 Oral     SpO2 12/11/18 2003 100 %     Weight 12/11/18 2000 160 lb (72.6 kg)     Height 12/11/18 2000 5\' 10"  (1.778 m)     Head Circumference --      Peak Flow --      Pain Score 12/11/18 2000 0     Pain Loc --      Pain Edu? --      Excl. in GC? --     Constitutional: Alert and oriented. Well appearing and in no apparent distress. HEENT:      Head: Normocephalic and atraumatic.         Eyes: Conjunctivae are normal. Sclera is non-icteric.       Mouth/Throat: Mucous membranes are moist.       Neck: Supple with no signs of meningismus. Cardiovascular: Regular rate and rhythm. No murmurs, gallops, or rubs. 2+ symmetrical distal pulses are present in all extremities. No JVD. Respiratory: Normal respiratory effort. Lungs are clear to auscultation bilaterally. No wheezes, crackles, or rhonchi.  Gastrointestinal: Soft, non tender, and non distended with positive bowel sounds. No rebound or guarding. Genitourinary: No CVA tenderness. Musculoskeletal: Nontender with normal range of motion in all extremities. No edema, cyanosis, or erythema of extremities. Neurologic: Normal speech and language. Face is symmetric. Moving all extremities. No gross focal neurologic deficits are appreciated. Skin: Skin is warm, dry and intact. No rash noted. Psychiatric: Mood and affect are normal. Speech and behavior are normal.  ____________________________________________   LABS (all labs ordered are listed, but only abnormal results are displayed)  Labs Reviewed  URINALYSIS, COMPLETE (UACMP) WITH MICROSCOPIC - Abnormal; Notable for the following components:      Result Value   Color, Urine BLOODY (*)    APPearance CLOUDY (*)    Glucose, UA   (*)    Value: TEST NOT REPORTED DUE TO COLOR INTERFERENCE OF URINE PIGMENT   Hgb urine dipstick   (*)    Value: TEST NOT REPORTED DUE TO  COLOR INTERFERENCE OF URINE PIGMENT   Bilirubin Urine   (*)    Value: TEST NOT REPORTED DUE TO COLOR INTERFERENCE OF URINE PIGMENT   Ketones, ur   (*)    Value: TEST NOT REPORTED DUE TO COLOR INTERFERENCE OF URINE PIGMENT   Protein, ur   (*)    Value: TEST NOT REPORTED DUE TO COLOR INTERFERENCE OF URINE PIGMENT   Nitrite   (*)    Value: TEST NOT REPORTED DUE TO COLOR INTERFERENCE OF URINE PIGMENT   Leukocytes,Ua   (*)    Value: TEST NOT REPORTED DUE TO COLOR INTERFERENCE OF URINE PIGMENT   RBC / HPF >50 (*)    All other components within normal limits  CBC - Abnormal; Notable for the following components:   WBC 3.3 (*)    Hemoglobin 12.9 (*)  All other components within normal limits  BASIC METABOLIC PANEL - Abnormal; Notable for the following components:   Calcium 8.5 (*)    All other components within normal limits  URINE CULTURE  PROTIME-INR  APTT   ____________________________________________  EKG  none  ____________________________________________  RADIOLOGY  I have personally reviewed the images performed during this visit and I agree with the Radiologist's read.   Interpretation by Radiologist:  Ct Renal Stone Study  Result Date: 12/11/2018 CLINICAL DATA:  Hematuria EXAM: CT ABDOMEN AND PELVIS WITHOUT CONTRAST TECHNIQUE: Multidetector CT imaging of the abdomen and pelvis was performed following the standard protocol without IV contrast. COMPARISON:  None. FINDINGS: Lower chest: Lung bases are free of acute infiltrate or sizable effusion. Pericardial calcifications are seen. Hepatobiliary: No focal liver abnormality is seen. No gallstones, gallbladder wall thickening, or biliary dilatation. Pancreas: Unremarkable. No pancreatic ductal dilatation or surrounding inflammatory changes. Spleen: Normal in size without focal abnormality. Adrenals/Urinary Tract: Adrenal glands are within normal limits. Kidneys are well visualized with scattered hypodensities consistent with  cysts. There is suggestion of a soft tissue lesion in the mid to upper portion of the left kidney best visualized on the coronal imaging no calculi or obstructive changes are seen. The bladder is well distended with significant impingement by the enlarged prostate. Stomach/Bowel: Scattered colonic fecal material is noted. No obstructive or inflammatory changes are seen. Small bowel and stomach are within normal limits. Vascular/Lymphatic: Aortic atherosclerosis. No enlarged abdominal or pelvic lymph nodes. Reproductive: Enlarged prostate is noted indenting upon the inferior aspect of the bladder. Correlation with PSA level is recommended. Other: No abdominal wall hernia or abnormality. No abdominopelvic ascites. Musculoskeletal: Degenerative changes of lumbar spine and hip joints are noted. No acute bony abnormality is seen. No metastatic disease is noted. IMPRESSION: No obstructive changes of the kidneys are noted. Multiple renal cysts are noted bilaterally. There is suggestion of a solid soft tissue lesion within the mid to upper pole of the left kidney. Given the patient's clinical history the possibility of underlying neoplasm could not be totally excluded. If able a non emergent contrast enhanced scan would be helpful. Alternatively and ultrasound of the kidneys may be helpful to rule out underlying lesion. Enlarged prostate. Correlation with PSA levels is recommended. The need for further workup can be determined on a clinical basis. No other focal abnormality is seen. Electronically Signed   By: Alcide Clever M.D.   On: 12/11/2018 20:48     ____________________________________________   PROCEDURES  Procedure(s) performed: None Procedures Critical Care performed:  None ____________________________________________   INITIAL IMPRESSION / ASSESSMENT AND PLAN / ED COURSE  85 y.o. male with a history of BPH, CAD, hypertension, nocturia who presents for evaluation of gross hematuria.  Not on blood  thinners, hemodynamically stable, stable hemoglobin.  No evidence of urinary retention.  UA is limited but no bacteria.  Patient was put on Keflex until culture is back.  CT renal was done showing multiple renal cysts bilaterally but also a solid soft tissue lesion in the mid to upper pole of the left kidney concerning for possible neoplasm.  I discussed these findings with patient and recommended close follow-up at [facility] urology.  Patient is already a patient there.  I have sent a message to Dr. Vanna Scotland to ensure close follow-up with.  I discussed signs and symptoms of UTI, pyelonephritis, and acute blood loss anemia with patient and recommended return to the emergency room if these develop.  At this time  with normal hemoglobin and normal vital signs I think patient is stable for discharge and outpatient management.  Also discussed return precautions for any signs of urinary retention.  Patient will call Dr. Apolinar Junes in the morning for follow-up.      As part of my medical decision making, I reviewed the following data within the electronic MEDICAL RECORD NUMBER Nursing notes reviewed and incorporated, Labs reviewed , Old chart reviewed, Radiograph reviewed , Notes from prior ED visits and [place] Controlled Substance Database    Pertinent labs & imaging results that were available during my care of the patient were reviewed by me and considered in my medical decision making (see chart for details).    ____________________________________________   FINAL CLINICAL IMPRESSION(S) / ED DIAGNOSES  Final diagnoses:  Gross hematuria      NEW MEDICATIONS STARTED DURING THIS VISIT:  ED Discharge Orders         Ordered    cephALEXin (KEFLEX) 500 MG capsule  3 times daily     12/11/18 2224           Note:  This document was prepared using Dragon voice recognition software and may include unintentional dictation errors.    Don Perking, [place], MD 12/11/18 2245

## 2018-12-11 NOTE — ED Notes (Signed)
Lab here for venipuncture assist.  

## 2018-12-11 NOTE — Discharge Instructions (Addendum)
Please call Dr. Delana Meyer office on Monday for close follow-up as your CT scan is suggestive of an abnormality on the tube that connects your kidney to your bladder which could be cancer.  It is okay for you to stay home if you are still having bloody urine as long as you have no dizziness, no chest pain, no shortness of breath, and you feel like you are able to fully empty your bladder.  Take the antibiotics as prescribed.

## 2018-12-11 NOTE — ED Notes (Signed)
 post void residual.

## 2018-12-11 NOTE — ED Triage Notes (Signed)
Patient states that he has blood and clots in his urine at home. Patient had bleeding at stat desk.

## 2018-12-11 NOTE — ED Notes (Signed)
Pt attempting to provide urine sample. Additional warm blankets provided.

## 2018-12-11 NOTE — ED Notes (Signed)
Pt in ct 

## 2018-12-11 NOTE — ED Notes (Signed)
Pt states he does not feel like he has to void, pt bladder scanned with in bladder. MD notified, states wishes to wait for possible foley insertion until pt states he has to void. Pt states he feels like at times he cannot urinate. Pt states had bright red blood noted from penis and blood clots when attempting to urinate that began at 1700 today.

## 2018-12-13 LAB — URINE CULTURE: Culture: <10000 — AB

## 2018-12-14 ENCOUNTER — Encounter: Payer: Self-pay | Admitting: Urology

## 2018-12-14 ENCOUNTER — Ambulatory Visit (INDEPENDENT_AMBULATORY_CARE_PROVIDER_SITE_OTHER): Payer: Medicare Other | Admitting: Urology

## 2018-12-14 ENCOUNTER — Other Ambulatory Visit: Payer: Self-pay

## 2018-12-14 ENCOUNTER — Telehealth: Payer: Self-pay | Admitting: Urology

## 2018-12-14 VITALS — BP 129/55 | HR 64 | Ht 70.0 in | Wt 164.1 lb

## 2018-12-14 DIAGNOSIS — R310 Gross hematuria: Secondary | ICD-10-CM

## 2018-12-14 DIAGNOSIS — N2889 Other specified disorders of kidney and ureter: Secondary | ICD-10-CM | POA: Diagnosis not present

## 2018-12-14 NOTE — Progress Notes (Signed)
12/14/2018 3:50 PM   [name] [birth year] [id number]  Referring provider: Kandyce Rud, MD [phone] [address] [facility] [place], [place] [address]  Chief Complaint  Patient presents with  . Hospitalization Follow-up    HPI: Mr. Oharrow is a 85 year old African American male who with a history of BPH with LU TS who was seen in the ED for gross hematuria and suspicious mass was seen on CT Renal stone study.   He presented to the ED on 12/11/2018 for the sudden onset of gross hematuria.     Labs in the ED:  UA - bloody with > 50 RBC's.  Urine culture had insignificant growth.  CBC Hbg 12.9 and HCT 41.2 %.  Serum creatinine 1.09.   CT Renal stone study revealed No obstructive changes of the kidneys are noted. Multiple renal cysts are noted bilaterally.  There is suggestion of a solid soft tissue lesion within the mid to upper pole of the left kidney. Given the patient's clinical history the possibility of underlying neoplasm could not be totally excluded. If able a non emergent contrast enhanced scan would be helpful. Alternatively and ultrasound of the kidneys may be helpful to rule out underlying lesion.  Enlarged prostate. Correlation with PSA levels is recommended. The need for further workup can be determined on a clinical basis.  No other focal abnormality is seen.   Meds given in the ED: Keflex   Today, he is not having gross hematuria.  He has no urinary complaints at this time.  Patient denies any gross hematuria, dysuria or suprapubic/flank pain.  Patient denies any fevers, chills, nausea or vomiting.  He states that his testicles have been sensitive and have decreased in size since his visit to the ED.    He has history of BPH that has minimal bother with low PVR's.    PMH: Past Medical History:  Diagnosis Date  . Abnormal weight loss   . Acute kidney failure (HCC)   . Atrial fibrillation (HCC)   . BPH (benign prostatic hyperplasia)  12/19/2014  . CAD (coronary artery disease)   . Elevated PSA   . Epididymitis   . Graves disease   . Graves disease   . Graves disease   . Hypertension   . Nocturia 12/19/2014  . Postoperative primary hypothyroidism     Surgical History: Past Surgical History:  Procedure Laterality Date  . EYE SURGERY    . TOTAL THYROIDECTOMY      Home Medications:  Allergies as of 12/14/2018   No Known Allergies     Medication List       Accurate as of Dec 14, 2018  3:50 PM. If you have any questions, ask your nurse or doctor.        acetaminophen 325 MG tablet Commonly known as:  TYLENOL Take 975 mg by mouth every 6 (six) hours as needed for mild pain or headache.   atorvastatin 10 MG tablet Commonly known as:  LIPITOR Take 10 mg by mouth daily.   carvedilol 3.125 MG tablet Commonly known as:  COREG Take 3.125 mg by mouth 2 (two) times daily.   cephALEXin 500 MG capsule Commonly known as:  KEFLEX Take 1 capsule (500 mg total) by mouth 3 (three) times daily for 7 days.   levothyroxine 112 MCG tablet Commonly known as:  SYNTHROID What changed:  Another medication with the same name was removed. Continue taking this medication, and follow the directions  you see here. Changed by:  Michiel Cowboy, PA-C       Allergies: No Known Allergies  Family History: Family History  Problem Relation Age of Onset  . Prostate cancer Brother   . Bladder Cancer Neg Hx   . Kidney cancer Neg Hx     Social History:  reports that he has never smoked. He has never used smokeless tobacco. He reports that he does not drink alcohol or use drugs.  ROS: UROLOGY Frequent Urination?: No Hard to postpone urination?: No Burning/pain with urination?: No Get up at night to urinate?: No Leakage of urine?: No Urine stream starts and stops?: No Trouble starting stream?: No Do you have to strain to urinate?: No Blood in urine?: Yes Urinary tract infection?: No Sexually transmitted disease?: No  Injury to kidneys or bladder?: No Painful intercourse?: No Weak stream?: No Erection problems?: No Penile pain?: No Gastrointestinal Nausea?: No Vomiting?: No Indigestion/heartburn?: No Diarrhea?: No Constipation?: No Constitutional Fever: No Night sweats?: No Weight loss?: No Fatigue?: No Skin Skin rash/lesions?: No Itching?: No Eyes Blurred vision?: No Double vision?: No Ears/Nose/Throat Sore throat?: No Sinus problems?: No Hematologic/Lymphatic Swollen glands?: No Easy bruising?: No Cardiovascular Leg swelling?: No Chest pain?: No Respiratory Cough?: No Shortness of breath?: No Endocrine Excessive thirst?: No Musculoskeletal Back pain?: No Joint pain?: No Neurological Headaches?: No Dizziness?: No Psychologic Depression?: No Anxiety?: No     Physical Exam: BP (!) 129/55 (BP Location: Left Arm, Patient Position: Sitting, Cuff Size: Normal)   Pulse 64   Ht  (1.778 m)   Wt 164 lb 1.6 oz (74.4 kg)   BMI 23.55 kg/m   Constitutional:  Well nourished. Alert and oriented, No acute distress. HEENT: [place] AT, moist mucus membranes.  Trachea midline, no masses. Cardiovascular: No clubbing, cyanosis, or edema. Respiratory: Normal respiratory effort, no increased work of breathing. GI: Abdomen is soft, non tender, non distended, no abdominal masses. Liver and spleen not palpable.  No hernias appreciated.  Stool sample for occult testing is not indicated.   GU: No CVA tenderness.  No bladder fullness or masses.  Patient with uncircumcised phallus. Foreskin easily retracted  Urethral meatus is patent.  No penile discharge. No penile lesions or rashes. Scrotum without lesions, cysts, rashes and/or edema.  Testicles are located scrotally bilaterally. No masses are appreciated in the testicles. Left and right epididymis are normal. Rectal: Not examined. Skin: No rashes, bruises or suspicious lesions. Lymph: No  inguinal adenopathy. Neurologic: Grossly intact, no  focal deficits, moving all 4 extremities. Psychiatric: Normal mood and affect.  Pertinent imaging CLINICAL DATA:  Hematuria  EXAM: CT ABDOMEN AND PELVIS WITHOUT CONTRAST  TECHNIQUE: Multidetector CT imaging of the abdomen and pelvis was performed following the standard protocol without IV contrast.  COMPARISON:  None.  FINDINGS: Lower chest: Lung bases are free of acute infiltrate or sizable effusion. Pericardial calcifications are seen.  Hepatobiliary: No focal liver abnormality is seen. No gallstones, gallbladder wall thickening, or biliary dilatation.  Pancreas: Unremarkable. No pancreatic ductal dilatation or surrounding inflammatory changes.  Spleen: Normal in size without focal abnormality.  Adrenals/Urinary Tract: Adrenal glands are within normal limits. Kidneys are well visualized with scattered hypodensities consistent with cysts. There is suggestion of a soft tissue lesion in the mid to upper portion of the left kidney best visualized on the coronal imaging no calculi or obstructive changes are seen. The bladder is well distended with significant impingement by the enlarged prostate.  Stomach/Bowel: Scattered colonic fecal material is  noted. No obstructive or inflammatory changes are seen. Small bowel and stomach are within normal limits.  Vascular/Lymphatic: Aortic atherosclerosis. No enlarged abdominal or pelvic lymph nodes.  Reproductive: Enlarged prostate is noted indenting upon the inferior aspect of the bladder. Correlation with PSA level is recommended.  Other: No abdominal wall hernia or abnormality. No abdominopelvic ascites.  Musculoskeletal: Degenerative changes of lumbar spine and hip joints are noted. No acute bony abnormality is seen. No metastatic disease is noted.  IMPRESSION: No obstructive changes of the kidneys are noted. Multiple renal cysts are noted bilaterally.  There is suggestion of a solid soft tissue lesion  within the mid to upper pole of the left kidney. Given the patient's clinical history the possibility of underlying neoplasm could not be totally excluded. If able a non emergent contrast enhanced scan would be helpful. Alternatively and ultrasound of the kidneys may be helpful to rule out underlying lesion.  Enlarged prostate. Correlation with PSA levels is recommended. The need for further workup can be determined on a clinical basis.  No other focal abnormality is seen.   Electronically Signed   By: Alcide CleverMark  Lukens M.D.   On: 12/11/2018 20:48  I have independently reviewed the films and appreciate the soft tissue lesion in the mid pole of left kidney.    Laboratory Data:  Assessment & Plan:    1. Gross hematuria -discussed with Mr. Christell ConstantMoore the findings on his CT Renal stone study and that it is necessary to proceed with a CT urogram for further evaluation of his left kidney as well as other areas in his GU system - I explained to the patient that a contrast material will be injected into a vein and that in rare instances, an allergic reaction can result and may even life threatening (1:100,000)  The patient denies any allergies to contrast, iodine and/or seafood and is not taking metformin - Following the imaging study,  I've recommended a cystoscopy. I described how this is performed, typically in an office setting with a flexible cystoscope. We described the risks, benefits, and possible side effects, the most common of which is a minor amount of blood in the urine and/or burning which usually resolves in 24 to 48 hours. - The patient had the opportunity to ask questions which were answered. Based upon this discussion, the patient is willing to proceed. Therefore, I've ordered: a CT Urogram and cystoscopy. - The patient will return following all of the above for discussion of the results.   2. BPH (benign prostatic hyperplasia) with LU TS - enlarged prostate seen on CT Renal stone  study - PSA not obtained today due to advanced age and multiple comorbidities   Return for CT Urogram report and cystoscopy.  Michiel CowboySHANNON Braxden Lovering, PA-C  [facility] [address] [place], [place] [id number] [phone]

## 2018-12-14 NOTE — Telephone Encounter (Signed)
Pt.'s wife states when he urinates it is pure blood no urine. Pt. Went to ER on 12/11/18 and was advised to F/U with [facility] as soon as possible.  Would like to be seen today.

## 2018-12-24 ENCOUNTER — Ambulatory Visit
Admission: RE | Admit: 2018-12-24 | Discharge: 2018-12-24 | Disposition: A | Discharge status: Home / Self Care | Payer: Medicare Other | Source: Ambulatory Visit | Attending: Urology | Admitting: Urology

## 2018-12-24 ENCOUNTER — Other Ambulatory Visit: Payer: Self-pay

## 2018-12-24 DIAGNOSIS — R310 Gross hematuria: Secondary | ICD-10-CM | POA: Diagnosis not present

## 2018-12-24 DIAGNOSIS — N2889 Other specified disorders of kidney and ureter: Secondary | ICD-10-CM

## 2018-12-24 MED ORDER — IOHEXOL 300 MG/ML  SOLN
150.0000 mL | Freq: Once | INTRAMUSCULAR | Status: AC | PRN
  Administered 2018-12-24: 125 mL via INTRAVENOUS

## 2018-12-29 ENCOUNTER — Encounter: Payer: Self-pay | Admitting: Urology

## 2018-12-29 ENCOUNTER — Other Ambulatory Visit: Payer: Self-pay

## 2018-12-29 ENCOUNTER — Ambulatory Visit (INDEPENDENT_AMBULATORY_CARE_PROVIDER_SITE_OTHER): Payer: Medicare Other | Admitting: Urology

## 2018-12-29 VITALS — BP 128/63 | HR 67 | Ht 70.0 in | Wt 164.0 lb

## 2018-12-29 DIAGNOSIS — R310 Gross hematuria: Secondary | ICD-10-CM

## 2018-12-29 LAB — URINALYSIS, COMPLETE
Bilirubin, UA: NEGATIVE
Glucose, UA: NEGATIVE
Ketones, UA: NEGATIVE
Leukocytes,UA: NEGATIVE
Nitrite, UA: NEGATIVE
RBC, UA: NEGATIVE
Specific Gravity, UA: >1.03 — ABNORMAL HIGH (ref 1.005–1.030)
Urobilinogen, Ur: 0.2 mg/dL (ref 0.2–1.0)
pH, UA: 5 (ref 5.0–7.5)

## 2018-12-29 LAB — MICROSCOPIC EXAMINATION
Bacteria, UA: NONE SEEN
Epithelial Cells (non renal): NONE SEEN /hpf (ref 0–10)
RBC, Urine: NONE SEEN /hpf (ref 0–2)
WBC, UA: NONE SEEN /hpf (ref 0–5)

## 2018-12-29 MED ORDER — LIDOCAINE HCL URETHRAL/MUCOSAL 2 % EX GEL: 1.0000 "application " | Freq: Once | CUTANEOUS | Status: DC

## 2018-12-29 MED ORDER — TAMSULOSIN HCL 0.4 MG PO CAPS: 0.4000 mg | ORAL_CAPSULE | Freq: Every day | ORAL | 11 refills | Status: DC

## 2018-12-29 MED ORDER — FINASTERIDE 5 MG PO TABS: 5.0000 mg | ORAL_TABLET | Freq: Every day | ORAL | 11 refills | Status: DC

## 2018-12-29 NOTE — Patient Instructions (Signed)

## 2018-12-29 NOTE — Progress Notes (Signed)
Cystoscopy Procedure Note:  Indication: Gross hematuria  After informed consent and discussion of the procedure and its risks, Sie Smellie was positioned and prepped in the standard fashion. Cystoscopy was performed with a flexible cystoscope. The urethra, bladder neck and entire bladder was visualized in a standard fashion. The prostate was massive with large median lobe. No bladder tumors or mucosal lesions noted. Retroflexion confirmed large median lobe. Mild oozing at prostate.  Imaging: CTU reviewed, 194g prostate, no enhancing renal masses, no calculi, no hydronephrosis  Findings: Very large prostate, no worrisome findings on CTU or cysto for malignancy  Assessment and Plan: 85 year old man with moderate urinary symptoms of weak stream and 2 episodes of gross hematuria with 194 g prostate on CT with large median lobe.  I recommended maximal medical therapy with Flomax and finasteride, and follow-up in 6 months for PVR.  RTC 6 months for PVR  Legrand Rams, MD 12/29/2018

## 2019-03-25 NOTE — Progress Notes (Signed)
03/26/2019 3:23 PM   Tyrone Keith 11/28/1932 [id number]  Referring provider: Kandyce RudBabaoff, Marcus, MD [phone] [address] [facility] [place] [facility] [place],  [place] [id number]  Chief Complaint  Patient presents with  . Benign Prostatic Hypertrophy    HPI: Tyrone Keith is a 85 year old male who with a history of hematuria and BPH with LU TS who presents today for follow up.  History of hematuria (high risk) Non-smoker.  CTU in 12/2018 revealed no calcifications are identified within the collecting system of either kidney, along the course of either ureter, or within the lumen of the urinary bladder. No hydroureteronephrosis. Several low-attenuation lesions in the right kidney, compatible with simple cysts, largest of which measure up to 1.8 cm in the lower pole. Other subcentimeter low-attenuation lesions in both kidneys are too small to definitively characterize, but statistically likely to represent tiny cysts. No suspicious renal lesions. Postcontrast delayed images demonstrate no definite filling defect within the collecting system of either kidney, along the course of either ureter, or within the lumen of the urinary bladder to strongly suggest the presence of a urothelial neoplasm at this time. Urinary bladder wall does appear thickened, likely related to bladder outlet obstruction from the enlarged prostate gland (discussed below). Right adrenal gland is normal in appearance. Stable 1.7 cm left adrenal nodule measures 24 HU, but is similar to prior study from 2012, presumably a benign lesions such as a lipid poor adenoma.  His cystoscopy with Dr. Richardo HanksSninsky in 03/2019 the prostate was massive with large median lobe. He denies any gross hematuria.    BPH WITH LUTS  (prostate and/or bladder) IPSS score: 12/2     PVR: 1 mL  Previous score: 4/1   Previous PVR: 33 mL  Major complaint(s):  Frequency, urgency and nocturia x few years. Denies any dysuria, hematuria or suprapubic pain.    Currently taking: tamsulosin 0.4 mg and finasteride 5 mg daily  Denies any recent fevers, chills, nausea or vomiting. IPSS    Row Name 03/26/19 1200         International Prostate Symptom Score   How often have you had the sensation of not emptying your bladder?  Less than 1 in 5     How often have you had to urinate less than every two hours?  Less than half the time     How often have you found you stopped and started again several times when you urinated?  Less than 1 in 5 times     How often have you found it difficult to postpone urination?  About half the time     How often have you had a weak urinary stream?  Less than 1 in 5 times     How often have you had to strain to start urination?  Less than 1 in 5 times     How many times did you typically get up at night to urinate?  3 Times     Total IPSS Score  12       Quality of Life due to urinary symptoms   If you were to spend the rest of your life with your urinary condition just the way it is now how would you feel about that?  Mostly Satisfied        Score:  1-7 Mild 8-19 Moderate 20-35 Severe   CTU noted extensive pericardial calcification in the atrioventricular grooves.  He mentions that he is becoming winded when chasing  the grandchildren and walking the dog.  He denies chest pain.    PMH: Past Medical History:  Diagnosis Date  . Abnormal weight loss   . Acute kidney failure ([facility])   . Atrial fibrillation ([place])   . BPH (benign prostatic hyperplasia) 12/19/2014  . CAD (coronary artery disease)   . Elevated PSA   . Epididymitis   . Graves disease   . Graves disease   . Graves disease   . Hypertension   . Nocturia 12/19/2014  . Postoperative primary hypothyroidism     Surgical History: Past Surgical History:  Procedure Laterality Date  . EYE SURGERY    . TOTAL THYROIDECTOMY      Home Medications:  Allergies as of 03/26/2019   No Known Allergies     Medication List       Accurate as of March 26, 2019 11:59 PM. If you have any questions, ask your nurse or doctor.        acetaminophen 325 MG tablet Commonly known as: TYLENOL Take 975 mg by mouth every 6 (six) hours as needed for mild pain or headache.   atorvastatin 10 MG tablet Commonly known as: LIPITOR Take 10 mg by mouth daily.   carvedilol 3.125 MG tablet Commonly known as: COREG Take 3.125 mg by mouth 2 (two) times daily.   finasteride 5 MG tablet Commonly known as: PROSCAR Take 1 tablet (5 mg total) by mouth daily.   levothyroxine 112 MCG tablet Commonly known as: SYNTHROID   tamsulosin 0.4 MG Caps capsule Commonly known as: FLOMAX Take 1 capsule (0.4 mg total) by mouth daily after supper.       Allergies: No Known Allergies  Family History: Family History  Problem Relation Age of Onset  . Prostate cancer Brother   . Bladder Cancer Neg Hx   . Kidney cancer Neg Hx     Social History:  reports that he has never smoked. He has never used smokeless tobacco. He reports that he does not drink alcohol or use drugs.  ROS: UROLOGY Frequent Urination?: Yes Hard to postpone urination?: Yes Burning/pain with urination?: No Get up at night to urinate?: Yes Leakage of urine?: No Urine stream starts and stops?: No Trouble starting stream?: No Do you have to strain to urinate?: No Blood in urine?: No Urinary tract infection?: No Sexually transmitted disease?: No Injury to kidneys or bladder?: No Painful intercourse?: No Weak stream?: No Erection problems?: Yes Penile pain?: No Gastrointestinal Nausea?: No Vomiting?: No Indigestion/heartburn?: No Diarrhea?: No Constipation?: No Constitutional Fever: No Night sweats?: No Weight loss?: No Fatigue?: No Skin Skin rash/lesions?: No Itching?: No Eyes Blurred vision?: No Double vision?: No Ears/Nose/Throat Sore throat?: No Sinus problems?: No Hematologic/Lymphatic Swollen glands?: No Easy bruising?: No Cardiovascular Leg swelling?: No  Chest pain?: No Respiratory Cough?: Yes Shortness of breath?: No Endocrine Excessive thirst?: No Musculoskeletal Back pain?: No Joint pain?: No Neurological Headaches?: No Dizziness?: No Psychologic Depression?: No Anxiety?: No     Physical Exam: BP 107/67 (BP Location: Left Arm, Patient Position: Sitting, Cuff Size: Normal)   Pulse 74   Ht 5\' 10"  (1.778 m)   Wt 152 lb 14.4 oz (69.4 kg)   BMI 21.94 kg/m   Constitutional:  Well nourished. Alert and oriented, No acute distress. HEENT: [place] AT, moist mucus membranes.  Trachea midline, no masses. Cardiovascular: No clubbing, cyanosis, or edema. Respiratory: Normal respiratory effort, no increased work of breathing. Neurologic: Grossly intact, no focal deficits, moving all 4 extremities. Psychiatric:  Normal mood and affect.   Pertinent imaging Results for Tyrone Keith, Tyrone Keith (MRN [id number]) as of 03/30/2019 15:14  Ref. Range 03/26/2019 11:57  Scan Result Unknown 1     Laboratory Data:  Assessment & Plan:    1. History of hematuria Hematuria work up completed in 12/2018 - findings positive for very large prostate with median lobe No report of gross hematuria  RTC in one year for UA - patient to report any gross hematuria in the interim    2. BPH with LUTS IPSS score is 12/2, it is worsening Continue conservative management, avoiding bladder irritants and timed voiding's Continue tamsulosin 0.4 mg daily and finasteride 5 mg daily: refills given Keep follow up in November with Dr. Lonna CobbStoioff   3. Pericardial calcification Patient will mild dyspnea on exertion - will refer onto to cardiology Instructed to seek treatment in the ED for Chest pain or SOB   Return for keep follow up with Dr. Lonna CobbStoioff in November .  Michiel CowboySHANNON Hadassa Cermak, PA-C  [facility] [address] [place], [place] [address] [phone]

## 2019-03-26 ENCOUNTER — Other Ambulatory Visit: Payer: Self-pay

## 2019-03-26 ENCOUNTER — Encounter: Payer: Self-pay | Admitting: Urology

## 2019-03-26 ENCOUNTER — Ambulatory Visit (INDEPENDENT_AMBULATORY_CARE_PROVIDER_SITE_OTHER): Payer: Medicare Other | Admitting: Urology

## 2019-03-26 VITALS — BP 107/67 | HR 74 | Ht 70.0 in | Wt 152.9 lb

## 2019-03-26 DIAGNOSIS — Z87448 Personal history of other diseases of urinary system: Secondary | ICD-10-CM

## 2019-03-26 DIAGNOSIS — N138 Other obstructive and reflux uropathy: Secondary | ICD-10-CM

## 2019-03-26 DIAGNOSIS — N401 Enlarged prostate with lower urinary tract symptoms: Secondary | ICD-10-CM | POA: Diagnosis not present

## 2019-03-26 DIAGNOSIS — I318 Other specified diseases of pericardium: Secondary | ICD-10-CM

## 2019-03-26 LAB — BLADDER SCAN AMB NON-IMAGING: Scan Result: 1

## 2019-05-10 ENCOUNTER — Ambulatory Visit: Payer: Medicare Other | Admitting: Internal Medicine

## 2019-05-10 NOTE — Progress Notes (Deleted)
New Outpatient Visit Date: 05/10/2019  Referring Provider: Harle Battiest, PA-C [address] [place],  [place] [address]  Chief Complaint: ***  HPI:  [name] is a 85 y.o. male who is being seen today for the evaluation of dyspnea on exertion and pericardial calcification at the request of Tyrone Keith. He has a history of coronary artery disease, atrial fibrillation, hypertension, Graves' disease with postoperative hypothyroidism, and BPH.  He was seen by Tyrone Keith in the urology clinic in late August for follow-up of hematuria and enlarged prostate.  Prior cross-sectional imaging of the abdomen and pelvis had incidentally shown pericardial calcification. ***  --------------------------------------------------------------------------------------------------  Cardiovascular History & Procedures: Cardiovascular Problems:  ***  Risk Factors:  ***  Cath/PCI:  ***  CV Surgery:  ***  EP Procedures and Devices:  ***  Non-Invasive Evaluation(s):  ***  Recent CV Pertinent Labs: Lab Results  Component Value Date   INR 1.1 12/11/2018   K 4.8 12/11/2018   K 4.5 09/23/2012   BUN 16 12/11/2018   BUN 12 09/23/2012   CREATININE 1.09 12/11/2018   CREATININE 1.00 09/23/2012    --------------------------------------------------------------------------------------------------  Past Medical History:  Diagnosis Date  . Abnormal weight loss   . Acute kidney failure ([facility])   . Atrial fibrillation ([facility])   . BPH (benign prostatic hyperplasia) 12/19/2014  . CAD (coronary artery disease)   . Elevated PSA   . Epididymitis   . Graves disease   . Graves disease   . Graves disease   . Hypertension   . Nocturia 12/19/2014  . Postoperative primary hypothyroidism     Past Surgical History:  Procedure Laterality Date  . EYE SURGERY    . TOTAL THYROIDECTOMY      No outpatient medications have been marked as taking for the 05/10/19 encounter  (Appointment) with Tyrone Keith, Tyrone Deer, MD.   Current Facility-Administered Medications for the 05/10/19 encounter (Appointment) with Tyrone Keith, Tyrone Deer, MD  Medication  . lidocaine (XYLOCAINE) 2 % jelly 1 application    Allergies: Patient has no known allergies.  Social History   Tobacco Use  . Smoking status: Never Smoker  . Smokeless tobacco: Never Used  Substance Use Topics  . Alcohol use: No    Alcohol/week: 0.0 standard drinks  . Drug use: No    Family History  Problem Relation Age of Onset  . Prostate cancer Brother   . Bladder Cancer Neg Hx   . Kidney cancer Neg Hx     Review of Systems: A 12-system review of systems was performed and was negative except as noted in the HPI.  --------------------------------------------------------------------------------------------------  Physical Exam: There were no vitals taken for this visit.  General:  *** HEENT: No conjunctival pallor or scleral icterus. Moist mucous membranes. OP clear. Neck: Supple without lymphadenopathy, thyromegaly, JVD, or HJR. No carotid bruit. Lungs: Normal work of breathing. Clear to auscultation bilaterally without wheezes or crackles. Heart: Regular rate and rhythm without murmurs, rubs, or gallops. Non-displaced PMI. Abd: Bowel sounds present. Soft, NT/ND without hepatosplenomegaly Ext: No lower extremity edema. Radial, PT, and DP pulses are 2+ bilaterally Skin: Warm and dry without rash. Neuro: CNIII-XII intact. Strength and fine-touch sensation intact in upper and lower extremities bilaterally. Psych: Normal mood and affect.  EKG:  ***  Lab Results  Component Value Date   WBC 3.3 (L) 12/11/2018   HGB 12.9 (L) 12/11/2018   HCT 41.2 12/11/2018   MCV 96.3 12/11/2018   PLT 153 12/11/2018    Lab  Results  Component Value Date   NA 142 12/11/2018   K 4.8 12/11/2018   CL 107 12/11/2018   CO2 29 12/11/2018   BUN 16 12/11/2018   CREATININE 1.09 12/11/2018   GLUCOSE 86 12/11/2018   ALT 10  (L) 02/22/2015    No results found for: CHOL, HDL, LDLCALC, LDLDIRECT, TRIG, CHOLHDL   --------------------------------------------------------------------------------------------------  ASSESSMENT AND PLAN: ***  Tyrone Bush, MD 05/10/2019 7:49 AM

## 2019-05-17 ENCOUNTER — Ambulatory Visit (INDEPENDENT_AMBULATORY_CARE_PROVIDER_SITE_OTHER): Payer: Medicare Other | Admitting: Internal Medicine

## 2019-05-17 ENCOUNTER — Other Ambulatory Visit
Admission: RE | Admit: 2019-05-17 | Discharge: 2019-05-17 | Disposition: A | Discharge status: Home / Self Care | Payer: Medicare Other | Source: Ambulatory Visit | Attending: Internal Medicine | Admitting: Internal Medicine

## 2019-05-17 ENCOUNTER — Other Ambulatory Visit: Payer: Self-pay

## 2019-05-17 ENCOUNTER — Encounter: Payer: Self-pay | Admitting: Internal Medicine

## 2019-05-17 VITALS — BP 132/60 | HR 59 | Ht 70.0 in | Wt 152.8 lb

## 2019-05-17 DIAGNOSIS — R0609 Other forms of dyspnea: Secondary | ICD-10-CM

## 2019-05-17 DIAGNOSIS — I2510 Atherosclerotic heart disease of native coronary artery without angina pectoris: Secondary | ICD-10-CM | POA: Diagnosis present

## 2019-05-17 DIAGNOSIS — I10 Essential (primary) hypertension: Secondary | ICD-10-CM

## 2019-05-17 DIAGNOSIS — R079 Chest pain, unspecified: Secondary | ICD-10-CM

## 2019-05-17 DIAGNOSIS — I2584 Coronary atherosclerosis due to calcified coronary lesion: Secondary | ICD-10-CM | POA: Diagnosis present

## 2019-05-17 DIAGNOSIS — R0602 Shortness of breath: Secondary | ICD-10-CM | POA: Diagnosis not present

## 2019-05-17 DIAGNOSIS — R0600 Dyspnea, unspecified: Secondary | ICD-10-CM | POA: Insufficient documentation

## 2019-05-17 DIAGNOSIS — I318 Other specified diseases of pericardium: Secondary | ICD-10-CM

## 2019-05-17 LAB — BASIC METABOLIC PANEL
Anion gap: 6 (ref 5–15)
BUN: 19 mg/dL (ref 8–23)
CO2: 27 mmol/L (ref 22–32)
Calcium: 9.1 mg/dL (ref 8.9–10.3)
Chloride: 106 mmol/L (ref 98–111)
Creatinine, Ser: 1.02 mg/dL (ref 0.61–1.24)
GFR calc Af Amer: >60 mL/min (ref >60)
GFR calc non Af Amer: >60 mL/min (ref >60)
Glucose, Bld: 91 mg/dL (ref 70–99)
Potassium: 5 mmol/L (ref 3.5–5.1)
Sodium: 139 mmol/L (ref 135–145)

## 2019-05-17 NOTE — Progress Notes (Signed)
New Outpatient Visit Date: 05/17/2019  Referring Provider: Nori Riis, PA-C [place],  [place] [address]  Chief Complaint: Back and chest discomfort  HPI:  Tyrone Keith is a 85 y.o. male who is being seen today for the evaluation of dyspnea on exertion and pericardial calcification at the request of Ms. McGowan. He has a history of coronary artery disease, atrial fibrillation, hypertension, Graves' disease with postoperative hypothyroidism, and BPH.  He was seen by Ms. McGowan in the urology clinic in late August for follow-up of hematuria and enlarged prostate.  Prior cross-sectional imaging of the abdomen and pelvis had incidentally shown pericardial calcification.  Today, Tyrone Keith reports that he has been feeling well.  However, week ago he began to experience pain in the left lower back that began radiating towards the side of his chest.  It began after he did strenuous activity outside.  He has continued to notice occasional pain along the left lateral side of his chest, which is unrelated to certain activities.  It is not exertional and seems to come and go throughout the day.  Tyrone Keith also notes shortness of breath over the last week, though in retrospect, he has experienced exertional dyspnea for years.  He believes it may be slightly worse now.  He denies palpitations, lightheadedness, orthopnea, and edema.  Tyrone Keith does not recall any prior cardiac disease, though on further questioning, he thinks he may have been told about coronary artery disease in the past.  Review of his chart is notable for an EKG demonstrating atrial fibrillation in 2008.  Myocardial perfusion stress test at that time was unrevealing.  Tyrone Keith does not recall any additional cardiac testing or follow-up since then.  --------------------------------------------------------------------------------------------------  Cardiovascular History & Procedures: Cardiovascular  Problems:  Pericardial calcification  Chest pain with questionable history of coronary artery disease  Atrial fibrillation  Risk Factors:  Possible CAD, hypertension, male gender, and age greater than 107  Cath/PCI:  None available  CV Surgery:  None  EP Procedures and Devices:  None  Non-Invasive Evaluation(s):  Exercise myocardial perfusion stress test (09/08/2006): Normal myocardial perfusion without evidence of ischemia.  LVEF 48%.  Recent CV Pertinent Labs: Lab Results  Component Value Date   INR 1.1 12/11/2018   K 5.0 05/17/2019   K 4.5 09/23/2012   BUN 19 05/17/2019   BUN 12 09/23/2012   CREATININE 1.02 05/17/2019   CREATININE 1.00 09/23/2012    --------------------------------------------------------------------------------------------------  Past Medical History:  Diagnosis Date  . Abnormal weight loss   . Acute kidney failure ([facility])   . Atrial fibrillation ([facility])   . BPH (benign prostatic hyperplasia) 12/19/2014  . CAD (coronary artery disease)   . Elevated PSA   . Epididymitis   . Graves disease   . Graves disease   . Graves disease   . Hypertension   . Nocturia 12/19/2014  . Postoperative primary hypothyroidism     Past Surgical History:  Procedure Laterality Date  . EYE SURGERY    . TOTAL THYROIDECTOMY      Current Meds  Medication Sig  . acetaminophen (TYLENOL) 325 MG tablet Take 975 mg by mouth every 6 (six) hours as needed for mild pain or headache.  Marland Kitchen atorvastatin (LIPITOR) 10 MG tablet Take 10 mg by mouth daily.  . carvedilol (COREG) 3.125 MG tablet Take 3.125 mg by mouth 2 (two) times daily.  . finasteride (PROSCAR) 5 MG tablet Take 1 tablet (5 mg total) by  mouth daily.  Marland Kitchen. levothyroxine (SYNTHROID) 112 MCG tablet Take 112 mcg by mouth daily before breakfast.   . tamsulosin (FLOMAX) 0.4 MG CAPS capsule Take 1 capsule (0.4 mg total) by mouth daily after supper.   Current Facility-Administered Medications for the 05/17/19 encounter  (Office Visit) with Jaedin Regina, Cristal Deerhristopher, MD  Medication  . lidocaine (XYLOCAINE) 2 % jelly 1 application    Allergies: Patient has no known allergies.  Social History   Tobacco Use  . Smoking status: Never Smoker  . Smokeless tobacco: Never Used  Substance Use Topics  . Alcohol use: No    Alcohol/week: 0.0 standard drinks  . Drug use: No    Family History  Problem Relation Age of Onset  . Prostate cancer Brother   . Heart disease Mother   . Bladder Cancer Neg Hx   . Kidney cancer Neg Hx     Review of Systems: A 12-system review of systems was performed and was negative except as noted in the HPI.  --------------------------------------------------------------------------------------------------  Physical Exam: BP 132/60 (BP Location: Right Arm, Patient Position: Sitting, Cuff Size: Normal)   Pulse (!) 59   Ht 5\' 10"  (1.778 m)   Wt 152 lb 12 oz (69.3 kg)   SpO2 98%   BMI 21.92 kg/m   General: NAD. HEENT: No conjunctival pallor or scleral icterus.  Facemask in place. Neck: Supple without lymphadenopathy, thyromegaly, JVD, or HJR. No carotid bruit. Lungs: Normal work of breathing. Clear to auscultation bilaterally without wheezes or crackles. Heart: Regular rate and rhythm without murmurs, rubs, or gallops. Non-displaced PMI. Abd: Bowel sounds present. Soft, NT/ND without hepatosplenomegaly Ext: No lower extremity edema. Radial, PT, and DP pulses are 2+ bilaterally Skin: Warm and dry without rash. Neuro: CNIII-XII intact. Strength and fine-touch sensation intact in upper and lower extremities bilaterally. Psych: Normal mood and affect.  EKG: Normal sinus rhythm with left axis deviation and nonspecific ST segment changes.  Lab Results  Component Value Date   WBC 3.3 (L) 12/11/2018   HGB 12.9 (L) 12/11/2018   HCT 41.2 12/11/2018   MCV 96.3 12/11/2018   PLT 153 12/11/2018    Lab Results  Component Value Date   NA 139 05/17/2019   K 5.0 05/17/2019   CL 106  05/17/2019   CO2 27 05/17/2019   BUN 19 05/17/2019   CREATININE 1.02 05/17/2019   GLUCOSE 91 05/17/2019   ALT 10 (L) 02/22/2015    No results found for: CHOL, HDL, LDLCALC, LDLDIRECT, TRIG, CHOLHDL   --------------------------------------------------------------------------------------------------  ASSESSMENT AND PLAN: Pericardial calcification: Incidentally noted on recent CT of the abdomen and pelvis.  I have personally reviewed the CT images, which show bulky calcification along the AV grooves.  There does not appear to be circumferential pericardial calcification.  Other than chronic exertional dyspnea, Mr. Christell ConstantMoore does not have any symptoms of constrictive pericarditis.  He appears euvolemic on exam today.  I have recommended that we obtain a transthoracic echocardiogram as well as a cardiac CTA for further evaluation.  Chest pain and shortness of breath: Dyspnea on exertion is longstanding but may have worsened recently.  Chest pain is atypical and certainly could be musculoskeletal.  However, given multiple cardiac risk factors and nonspecific ST segment changes on EKG today, I have recommended that we obtain a cardiac CTA to evaluate for significant CAD.  This will also allow us to further assess the patient's pericardium.  In the meantime, Mr. Christell ConstantMoore should continue his current medications, including carvedilol and atorvastatin.  We  will defer adding aspirin for now, given recent episodes of gross hematuria.  Hypertension: Blood pressure upper normal today.  Continue carvedilol 3.125 mg twice daily.  Follow-up: Return to clinic in 6 weeks.  Yvonne Kendall, MD 05/19/2019 6:40 AM

## 2019-05-17 NOTE — Patient Instructions (Signed)
Medication Instructions:  Your physician recommends that you continue on your current medications as directed. Please refer to the Current Medication list given to you today.  If you need a refill on your cardiac medications before your next appointment, please call your pharmacy.   Lab work: Your physician recommends that you return for lab work in: TODAY - BMET - On your way out to the Medical Mall. - Please go to the [facility]. You will check in at the front desk to the right as you walk into the atrium. Valet Parking is offered if needed. - No appointment needed. You may go any day between 7 am and 6 pm.  If you have labs (blood work) drawn today and your tests are completely normal, you will receive your results only by: Marland Kitchen. MyChart Message (if you have MyChart) OR . A paper copy in the mail If you have any lab test that is abnormal or we need to change your treatment, we will call you to review the results.  Testing/Procedures: 1) Your physician has requested that you have an echocardiogram. Echocardiography is a painless test that uses sound waves to create images of your heart. It provides your doctor with information about the size and shape of your heart and how well your heart's chambers and valves are working. This procedure takes approximately one hour. There are no restrictions for this procedure. You may get an IV, if needed, to receive an ultrasound enhancing agent through to better visualize your heart.    2) Your physician has requested that you have cardiac CT. Cardiac computed tomography (CT) is a painless test that uses an x-ray machine to take clear, detailed pictures of your heart. For further information please visit [email or web address]. Please follow instruction sheet as given.    Your cardiac CT will be scheduled at one of the below locations:   [facility] [address] [place], [place] [address] [phone]  OR  [facility] [address] [place], [place] [address] [phone]  If scheduled at [facility], please arrive at the [facility] main entrance of [facility] 30-45 minutes prior to test start time. Proceed to the [facility] Department (first floor) to check-in and test prep.  If scheduled at [facility], please arrive 15 mins early for check-in and test prep.  Please follow these instructions carefully (unless otherwise directed):  Hold all erectile dysfunction medications at least 3 days (72 hrs) prior to test.  On the Night Before the Test: . Be sure to Drink plenty of water. . Do not consume any caffeinated/decaffeinated beverages or chocolate 12 hours prior to your test. . Do not take any antihistamines 12 hours prior to your test. .  On the Day of the Test: . Drink plenty of water. Do not drink any water within one hour of the test. . Do not eat any food 4 hours prior to the test. . You may take your regular medications prior to the test.  . Take metoprolol (Lopressor) two hours prior to test. . HOLD Furosemide/Hydrochlorothiazide morning of the test.       After the Test: . Drink plenty of water. . After receiving IV contrast, you may experience a mild flushed feeling. This is normal. . On occasion, you may experience a mild rash up to 24 hours after the test. This is not dangerous. If this occurs, you can take Benadryl 25 mg  and increase your fluid intake. . If you experience trouble breathing, this can be serious. If it is severe call 911 IMMEDIATELY. If it is mild, please call our office. . If you take any of these medications: Glipizide/Metformin, Avandament, Glucavance, please do not take 48 hours after completing test unless otherwise instructed.    Please contact the cardiac imaging nurse navigator should you have any questions/concerns Rockwell Alexandria, RN Navigator Cardiac Imaging [facility] [phone] Office  [phone] Cell     Follow-Up: At [facility], you and your health needs are our priority.  As part of our continuing mission to provide you with exceptional heart care, we have created designated Provider Care Teams.  These Care Teams include your primary Cardiologist (physician) and Advanced Practice Providers (APPs -  Physician Assistants and Nurse Practitioners) who all work together to provide you with the care you need, when you need it. You will need a follow up appointment in 6 weeks.  You may see DR Cristal Deer END or one of the following Advanced Practice Providers on your designated Care Team:   Nicolasa Ducking, NP Eula Listen, PA-C . Marisue Ivan, PA-C    Echocardiogram An echocardiogram is a procedure that uses painless sound waves (ultrasound) to produce an image of the heart. Images from an echocardiogram can provide important information about:  Signs of coronary artery disease (CAD).  Aneurysm detection. An aneurysm is a weak or damaged part of an artery wall that bulges out from the normal force of blood pumping through the body.  Heart size and shape. Changes in the size or shape of the heart can be associated with certain conditions, including heart failure, aneurysm, and CAD.  Heart muscle function.  Heart valve function.  Signs of a past heart attack.  Fluid buildup around the heart.  Thickening of the heart muscle.  A tumor or infectious growth around the heart valves. Tell a health care provider about:  Any allergies you have.  All medicines you are taking, including vitamins, herbs, eye drops, creams, and over-the-counter medicines.  Any blood disorders you have.  Any surgeries you have had.  Any medical conditions you have.  Whether you are pregnant or may be pregnant. What are the risks? Generally, this is a safe procedure. However, problems may occur, including:  Allergic reaction  to dye (contrast) that may be used during the procedure. What happens before the procedure? No specific preparation is needed. You may eat and drink normally. What happens during the procedure?   An IV tube may be inserted into one of your veins.  You may receive contrast through this tube. A contrast is an injection that improves the quality of the pictures from your heart.  A gel will be applied to your chest.  A wand-like tool (transducer) will be moved over your chest. The gel will help to transmit the sound waves from the transducer.  The sound waves will harmlessly bounce off of your heart to allow the heart images to be captured in real-time motion. The images will be recorded on a computer. The procedure may vary among health care providers and hospitals. What happens after the procedure?  You may return to your normal, everyday life, including diet, activities, and medicines, unless your health care provider tells you not to do that. Summary  An echocardiogram is a procedure that uses painless sound waves (ultrasound) to produce an image of the heart.  Images from an echocardiogram can provide important  information about the size and shape of your heart, heart muscle function, heart valve function, and fluid buildup around your heart.  You do not need to do anything to prepare before this procedure. You may eat and drink normally.  After the echocardiogram is completed, you may return to your normal, everyday life, unless your health care provider tells you not to do that. This information is not intended to replace advice given to you by your health care provider. Make sure you discuss any questions you have with your health care provider. Document Released: 07/19/2000 Document Revised: 11/12/2018 Document Reviewed: 08/24/2016 Elsevier Patient Education  2020 [employer].

## 2019-05-19 ENCOUNTER — Encounter: Payer: Self-pay | Admitting: Internal Medicine

## 2019-05-19 DIAGNOSIS — I318 Other specified diseases of pericardium: Secondary | ICD-10-CM | POA: Insufficient documentation

## 2019-05-19 DIAGNOSIS — R079 Chest pain, unspecified: Secondary | ICD-10-CM | POA: Insufficient documentation

## 2019-05-19 DIAGNOSIS — R0602 Shortness of breath: Secondary | ICD-10-CM | POA: Insufficient documentation

## 2019-05-25 ENCOUNTER — Telehealth (HOSPITAL_COMMUNITY): Payer: Self-pay | Admitting: Emergency Medicine

## 2019-05-25 NOTE — Telephone Encounter (Signed)
Reaching out to patient to offer assistance regarding upcoming cardiac imaging study; pt verbalizes understanding of appt date/time, parking situation and where to check in, pre-test NPO status and medications ordered, and verified current allergies; name and call back number provided for further questions should they arise Dare Spillman RN Navigator Cardiac Imaging [facility] [phone] office [phone] cell 

## 2019-05-26 ENCOUNTER — Other Ambulatory Visit: Payer: Self-pay

## 2019-05-26 ENCOUNTER — Ambulatory Visit
Admission: RE | Admit: 2019-05-26 | Discharge: 2019-05-26 | Disposition: A | Discharge status: Home / Self Care | Payer: Medicare Other | Source: Ambulatory Visit | Attending: Internal Medicine | Admitting: Internal Medicine

## 2019-05-26 DIAGNOSIS — R0602 Shortness of breath: Secondary | ICD-10-CM | POA: Diagnosis present

## 2019-05-26 DIAGNOSIS — I2510 Atherosclerotic heart disease of native coronary artery without angina pectoris: Secondary | ICD-10-CM | POA: Diagnosis not present

## 2019-05-26 DIAGNOSIS — I700 Atherosclerosis of aorta: Secondary | ICD-10-CM | POA: Insufficient documentation

## 2019-05-26 DIAGNOSIS — I318 Other specified diseases of pericardium: Secondary | ICD-10-CM

## 2019-05-26 DIAGNOSIS — R079 Chest pain, unspecified: Secondary | ICD-10-CM | POA: Diagnosis present

## 2019-05-26 MED ORDER — IOHEXOL 350 MG/ML SOLN
75.0000 mL | Freq: Once | INTRAVENOUS | Status: AC | PRN
  Administered 2019-05-26: 75 mL via INTRAVENOUS

## 2019-05-26 MED ORDER — NITROGLYCERIN 0.4 MG SL SUBL
0.8000 mg | SUBLINGUAL_TABLET | Freq: Once | SUBLINGUAL | Status: AC
  Administered 2019-05-26: 0.8 mg via SUBLINGUAL

## 2019-05-26 NOTE — Progress Notes (Signed)
Pt tolerated CTA test without incident; pt denies dizziness or lightheadedness; pt ambulatory to lobby with steady gait; pt denied wanting bottled water or other refreshment

## 2019-05-27 DIAGNOSIS — I2510 Atherosclerotic heart disease of native coronary artery without angina pectoris: Secondary | ICD-10-CM

## 2019-05-27 HISTORY — DX: Atherosclerotic heart disease of native coronary artery without angina pectoris: I25.10

## 2019-05-30 DIAGNOSIS — I318 Other specified diseases of pericardium: Secondary | ICD-10-CM | POA: Diagnosis not present

## 2019-05-30 DIAGNOSIS — R079 Chest pain, unspecified: Secondary | ICD-10-CM | POA: Diagnosis not present

## 2019-05-31 ENCOUNTER — Telehealth: Payer: Self-pay

## 2019-05-31 NOTE — Telephone Encounter (Signed)
Call to patient to discuss results of coronary CT. He verbalized understanding.   We confirmed new orders and ultrasound date.   Pt cooperative with POC>   No further questions at this time.   Advised pt to call for any further questions or concerns.

## 2019-05-31 NOTE — Telephone Encounter (Signed)
-----   Message from Nelva Bush, MD sent at 05/30/2019  9:10 PM EDT ----- Please let Tyrone Keith know that his cardiac CTA confirms pericardial calcification, similar to what was seen Keith the prior CT of the abdomen pelvis.  We will need to proceed with echocardiogram to assess the hemodynamic significance of this.  He also has moderate to severe narrowing in one of his heart arteries (left circumflex), which could be contributing to some of his shortness of breath and atypical chest pain.  I recommend that he start taking aspirin 81 mg daily as long as he has not had any further hematuria.  I also recommend that he start taking isosorbide mononitrate 15 mg daily to see if his symptoms improve.  We will follow-up with him after completion of the echocardiogram.

## 2019-06-10 ENCOUNTER — Telehealth: Payer: Self-pay

## 2019-06-10 MED ORDER — ISOSORBIDE MONONITRATE ER 30 MG PO TB24: 15.0000 mg | ORAL_TABLET | Freq: Every day | ORAL | 1 refills | Status: DC

## 2019-06-10 NOTE — Telephone Encounter (Signed)
Patient states his heart medication has not been called in, but is not sure the name of it. Called to [facility] in [place]

## 2019-06-10 NOTE — Telephone Encounter (Signed)
Spoke with the patient. Isosorbide 15mg  daily sent to the patient's pharmacy. Patient sts that he has not had any reoccurrence of hematuria and will start the Asa 81mg  daily as recommended by Dr. Saunders Revel. Patient voiced appreciation for the call.     ----- Message from Nelva Bush, MD sent at 05/30/2019  9:10 PM EDT ----- Please let Tyrone Keith know that his cardiac CTA confirms pericardial calcification, similar to what was seen on the prior CT of the abdomen pelvis.  We will need to proceed with echocardiogram to assess the hemodynamic significance of this.  He also has moderate to severe narrowing in one of his heart arteries (left circumflex), which could be contributing to some of his shortness of breath and atypical chest pain.  I recommend that he start taking aspirin 81 mg daily as long as he has not had any further hematuria.  I also recommend that he start taking isosorbide mononitrate 15 mg daily to see if his symptoms improve.  We will follow-up with him after completion of the echocardiogram.

## 2019-06-10 NOTE — Telephone Encounter (Signed)
I spoke with pt today and he mentioned that he needed a new Rx sent to his local pharmacy for his heart medication. Pt doesn't recall the name of medication and denied any listed medications needing to be refilled. Pt has CT scan and I saw were Dr.End noted that he was suggesting Isosorbide 15 mg tablet qd. Please advise correct medication and Rx for medication due to medication not being on medication list.

## 2019-06-18 ENCOUNTER — Other Ambulatory Visit: Payer: Self-pay | Admitting: Internal Medicine

## 2019-06-18 DIAGNOSIS — R079 Chest pain, unspecified: Secondary | ICD-10-CM

## 2019-06-18 DIAGNOSIS — I318 Other specified diseases of pericardium: Secondary | ICD-10-CM

## 2019-06-18 DIAGNOSIS — R0602 Shortness of breath: Secondary | ICD-10-CM

## 2019-06-25 ENCOUNTER — Other Ambulatory Visit: Payer: Medicare Other

## 2019-06-27 NOTE — Progress Notes (Deleted)
Cardiology Office Note    Date:  06/27/2019   ID:  Tyrone Keith, DOB August 19, 1932, MRN [id number]  PCP:  Kandyce Rud, MD  Cardiologist:  Yvonne Kendall, MD  Electrophysiologist:  None   Chief Complaint: Follow up  History of Present Illness:   Tyrone Keith is a 85 y.o. male with history of CAD as noted below in the setting of noninvasive imaging, pericardial calcification, Afib noted on EKG in 2008, HTN, Graves' disease with postoperative hypothyroidism, possible ILD, and BPH who presents for follow up.   Prior EKG from 2008 showed Afib with subsequent myocardial perfusion testing being unrevealing.   Patient was seen by Dr. Okey Dupre as a new patient in 05/2019 after incident finding of coronary artery calcification noted on imaging in the setting of hematuria and enlarged spleen. He was feeling well at that time, though did note some left lower back pain that radiated towards the side of his chest along wit some SOB that was intermittent with noted DOE for years. In this setting, he underwent coronary CTA on 05/26/2019 which showed a calcium score of 250, placing the patient in the 52nd percentile, nonobstructive disease involving the LAD, ostial LCx stenosis around 50% with an FFR of 0.8, along with prior noted pericardial calcification especially adjacent to the RV. There was also mention of incidentally noted possible fibrotic ILD on nocardiac overread. Echo was advised, though remains pending at this time. In this setting, he was started on ASA 81 mg (pending no further hematuria) and Imdur 15 mg daily.   ***   Labs: 06/2019 - HGB 12.9, PLT 169, potassium 4.4, BUN 14, SCr 1.0, albumin 3.6, AST/ALT normal, TC 128, TG 49, HDL 54, LDL 64, TSH low 0.345  Refer to pulmonary for possible ILD***   Past Medical History:  Diagnosis Date  . Abnormal weight loss   . Acute kidney failure (HCC)   . Atrial fibrillation (HCC)   . BPH (benign prostatic hyperplasia) 12/19/2014  . CAD (coronary  artery disease)   . Elevated PSA   . Epididymitis   . Graves disease   . Graves disease   . Graves disease   . Hypertension   . Nocturia 12/19/2014  . Postoperative primary hypothyroidism     Past Surgical History:  Procedure Laterality Date  . EYE SURGERY    . TOTAL THYROIDECTOMY      Current Medications: No outpatient medications have been marked as taking for the 06/28/19 encounter (Appointment) with Sondra Barges, PA-C.   Current Facility-Administered Medications for the 06/28/19 encounter (Appointment) with Sondra Barges, PA-C  Medication  . lidocaine (XYLOCAINE) 2 % jelly 1 application    Allergies:   Patient has no known allergies.   Social History   Socioeconomic History  . Marital status: Married    Spouse name: Not on file  . Number of children: Not on file  . Years of education: Not on file  . Highest education level: Not on file  Occupational History  . Not on file  Social Needs  . Financial resource strain: Not on file  . Food insecurity    Worry: Not on file    Inability: Not on file  . Transportation needs    Medical: Not on file    Non-medical: Not on file  Tobacco Use  . Smoking status: Never Smoker  . Smokeless tobacco: Never Used  Substance and Sexual Activity  . Alcohol use: No    Alcohol/week: 0.0 standard drinks  .  Drug use: No  . Sexual activity: Yes  Lifestyle  . Physical activity    Days per week: Not on file    Minutes per session: Not on file  . Stress: Not on file  Relationships  . Social Herbalist on phone: Not on file    Gets together: Not on file    Attends religious service: Not on file    Active member of club or organization: Not on file    Attends meetings of clubs or organizations: Not on file    Relationship status: Not on file  Other Topics Concern  . Not on file  Social History Narrative  . Not on file     Family History:  The patient's family history includes Heart disease in his mother; Prostate  cancer in his brother. There is no history of Bladder Cancer or Kidney cancer.  ROS:   ROS   EKGs/Labs/Other Studies Reviewed:    Studies reviewed were summarized above. The additional studies were reviewed today:  Coronary CTA 05/2019: Coronary Arteries: Right dominant with no anomalies  LM: Calcified plaque, minimal stenosis.  LAD system: Small areas of calcified plaque proximal and mid LAD, no significant stenosis. There is misregistration artifact but I do not think that it inhibits the ability to interpret the artery.  Circumflex system: Mixed plaque ostial LCx, probably no more than 50% stenosis. Mixed plaque proximal LCx, mild (<50%) stenosis.  RCA system: No plaque or stenosis.  IMPRESSION: 1. Coronary artery calcium score 250 Agatston units. This places the patient in the 52nd percentile for age and gender, suggesting intermediate risk for future cardiac events.  2.  Nonobstructive disease in the LAD.  3. Ostial LCx stenosis appears to be around 50%, but will send for FFR to confirm.  4.  Pericardial calcification noted, especially adjacent to the RV.  FFR 0.8 in the proximal LCx and 0.75 in the mid LCx.  IMPRESSION: Hemodynamically significant ostial and proximal LCx stenosis.   EKG:  EKG is ordered today.  The EKG ordered today demonstrates ***  Recent Labs: 12/11/2018: Hemoglobin 12.9; Platelets 153 05/17/2019: BUN 19; Creatinine, Ser 1.02; Potassium 5.0; Sodium 139  Recent Lipid Panel No results found for: CHOL, TRIG, HDL, CHOLHDL, VLDL, LDLCALC, LDLDIRECT  PHYSICAL EXAM:    VS:  There were no vitals taken for this visit.  BMI: There is no height or weight on file to calculate BMI.  Physical Exam  Wt Readings from Last 3 Encounters:  05/17/19 152 lb 12 oz (69.3 kg)  03/26/19 152 lb 14.4 oz (69.4 kg)  12/29/18 164 lb (74.4 kg)     ASSESSMENT & PLAN:   1. ***  Disposition: F/u with Dr. Saunders Revel or an APP in ***.   Medication  Adjustments/Labs and Tests Ordered: Current medicines are reviewed at length with the patient today.  Concerns regarding medicines are outlined above. Medication changes, Labs and Tests ordered today are summarized above and listed in the Patient Instructions accessible in Encounters.   Signed, Christell Faith, PA-C 06/27/2019 10:33 AM     [facility] [address] [place] [address] [place], [place] [address] [phone]

## 2019-06-28 ENCOUNTER — Ambulatory Visit: Payer: Medicare Other | Admitting: Physician Assistant

## 2019-07-05 ENCOUNTER — Other Ambulatory Visit: Payer: Self-pay

## 2019-07-05 ENCOUNTER — Ambulatory Visit (INDEPENDENT_AMBULATORY_CARE_PROVIDER_SITE_OTHER): Payer: Medicare Other | Admitting: Urology

## 2019-07-05 ENCOUNTER — Ambulatory Visit: Payer: Medicare Other | Admitting: Urology

## 2019-07-05 ENCOUNTER — Encounter: Payer: Self-pay | Admitting: Urology

## 2019-07-05 VITALS — BP 127/61 | HR 66 | Ht 70.0 in | Wt 157.2 lb

## 2019-07-05 DIAGNOSIS — N401 Enlarged prostate with lower urinary tract symptoms: Secondary | ICD-10-CM

## 2019-07-05 DIAGNOSIS — N138 Other obstructive and reflux uropathy: Secondary | ICD-10-CM

## 2019-07-05 DIAGNOSIS — R310 Gross hematuria: Secondary | ICD-10-CM | POA: Diagnosis not present

## 2019-07-05 LAB — BLADDER SCAN AMB NON-IMAGING

## 2019-07-05 NOTE — Progress Notes (Signed)
   07/05/2019 2:26 PM   Tyrone Keith 21-Feb-1933 [id number]  Reason for visit: Follow up hematuria  HPI: I saw [name] back in urology clinic for follow-up of hematuria.  He is an 85 year old male who had multiple episodes of hematuria and underwent complete work-up with CT urogram and cystoscopy in May 2020.  This showed a very large prostate that measured 194g with a large intravesical lobe with some mild oozing at the bladder neck, but no other abnormalities.  He is on maximal medical therapy with Flomax and finasteride.  He denies any further episodes of hematuria.  He denies any urinary complaints today specifically weak stream, urgency, frequency, incontinence, or UTIs.  PVR 0 mL.  Continue Flomax and finasteride RTC 1 year with Zara Council, PA for PVR/BPH symptom check  A total of 15 minutes were spent face-to-face with the patient, greater than 50% was spent in patient education, counseling, and coordination of care regarding BPH and history of gross hematuria.  [place], [facility] [address] [place], [place] [address] [phone]

## 2019-08-12 ENCOUNTER — Ambulatory Visit (INDEPENDENT_AMBULATORY_CARE_PROVIDER_SITE_OTHER): Payer: Medicare Other

## 2019-08-12 ENCOUNTER — Other Ambulatory Visit: Payer: Self-pay

## 2019-08-12 DIAGNOSIS — I318 Other specified diseases of pericardium: Secondary | ICD-10-CM | POA: Diagnosis not present

## 2019-08-12 DIAGNOSIS — I5189 Other ill-defined heart diseases: Secondary | ICD-10-CM

## 2019-08-12 DIAGNOSIS — R0602 Shortness of breath: Secondary | ICD-10-CM | POA: Diagnosis not present

## 2019-08-12 DIAGNOSIS — R079 Chest pain, unspecified: Secondary | ICD-10-CM

## 2019-08-12 HISTORY — DX: Other ill-defined heart diseases: I51.89

## 2019-08-19 ENCOUNTER — Ambulatory Visit: Payer: Medicare Other | Admitting: Internal Medicine

## 2019-08-19 NOTE — Progress Notes (Signed)
Follow-up Outpatient Visit Date: 08/20/2019  Primary Care Provider: Derinda Late, MD [phone] [facility] [place] [address]  Chief Complaint: Follow-up coronary artery disease and pericardial calcification  HPI:  [name] is a 86 y.o. male with history of coronary artery disease, pericardial calcification (incidentally noted), atrial fibrillation, hypertension, Graves' disease with postoperative hypothyroidism, and BPH, who presents for follow-up of pericardial calcification and atypical chest pain.  I met him in October after pericardial calcification was incidentally noted on an abdominal/pelvic CT for work-up of hematuria.  [name] reported intermittent back pain that would radiate to the left lateral side of his chest.  Cardiac CTA confirmed pericardial calcification as well as fibrotic changes in the lungs.  Single vessel CAD was noted with moderate to severe proximal LCx stenosis noted (CT FFR 0.8 in proximal LCx and 0.75 in mid vessel).  Echo showed normal LVEF with grade 1 diastolic dysfunction, elevated CVP and equivocal findings of constrictive physiology.  Today, [name] reports that he is feeling well.  He denies chest pain, shortness of breath, palpitations, lightheadedness, orthopnea, and edema.  He is tolerating isosorbide mononitrate well, which was added after cardiac CTA.  However, he is unsure if it made him feel any different.  Upon further questioning, 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  Grade 1 diastolic dysfunction.  Normal RV size and function.  Aortic sclerosis.  Mild MR.  Elevated CVP (~15 mmHg).  Cardiac CTA (05/26/2019): No significant disease involving the LAD and RCA.  Moderate ostial LCx disease that is hemodynamically significant by CTFFR (proximal LCx 0.8, mid LCx 0.75).  Pericardial calcification noted as well as fibrotic changes in the lungs.  Exercise myocardial perfusion stress test (09/08/2006): Normal myocardial perfusion without evidence of ischemia.  LVEF 48%.  Recent CV Pertinent Labs: Lab Results  Component Value Date   INR 1.1 12/11/2018   K 5.0 05/17/2019   K 4.5 09/23/2012   BUN 19 05/17/2019   BUN 12 09/23/2012   CREATININE 1.02 05/17/2019   CREATININE 1.00 09/23/2012    Past medical and surgical history were reviewed and updated in EPIC.  Current Meds  Medication Sig  . acetaminophen (TYLENOL) 325 MG tablet Take 975 mg by mouth every 6 (six) hours as needed for mild pain or headache.  Marland Kitchen aspirin EC 81 MG tablet Take 81 mg by mouth daily.  Marland Kitchen atorvastatin (LIPITOR) 10 MG tablet Take 10 mg by mouth daily.  . carvedilol (COREG) 3.125 MG tablet Take 3.125 mg by mouth 2 (two) times daily.  . finasteride (PROSCAR) 5 MG tablet Take 1 tablet (5 mg total) by mouth daily.  . isosorbide mononitrate (IMDUR) 30 MG 24 hr tablet Take 0.5 tablets (15 mg total) by mouth daily.  Marland Kitchen levothyroxine (SYNTHROID) 100 MCG tablet Take by mouth.  . tamsulosin (FLOMAX) 0.4 MG CAPS capsule Take 1 capsule (0.4 mg total) by mouth daily after supper.   Current Facility-Administered Medications for the 08/20/19 encounter (Office Visit) with  Nelva Bush, MD  Medication  . lidocaine (XYLOCAINE) 2 % jelly 1 application    Allergies: Patient has no known allergies.  Social History   Tobacco Use   . Smoking status: Never Smoker  . Smokeless tobacco: Never Used  Substance Use Topics  . Alcohol use: No    Alcohol/week: 0.0 standard drinks  . Drug use: No    Family History  Problem Relation Age of Onset  . Prostate cancer Brother   . Heart disease Mother   . Bladder Cancer Neg Hx   . Kidney cancer Neg Hx     Review of Systems: [name] complains of intermittent urinary frequency and urge incontinence.  He has not had any further hematuria.  Otherwise, a 12-system review of systems was performed and was negative except as noted in the HPI.  --------------------------------------------------------------------------------------------------  Physical Exam: BP (!) 102/52 (BP Location: Left Arm, Patient Position: Sitting, Cuff Size: Normal)   Pulse 60   Ht '5\' 10"'  (1.778 m)   Wt 156 lb 12 oz (71.1 kg)   SpO2 99%   BMI 22.49 kg/m   General:  NAD. HEENT: No conjunctival pallor or scleral icterus. Facemask in place. Neck: Supple without lymphadenopathy, thyromegaly, JVD, or HJR. Lungs: Normal work of breathing. Clear to auscultation bilaterally without wheezes or crackles. Heart: Regular rate and rhythm without murmurs, rubs, or gallops. Non-displaced PMI. Abd: Bowel sounds present. Soft, NT/ND without hepatosplenomegaly Ext: No lower extremity edema. Radial, PT, and DP pulses are 2+ bilaterally. Skin: Warm and dry without rash.  EKG:  NSR with left axis deviation, lateral T wave inversions, and possible inferior Q waves.  Lateral T wave inversions are new compared with prior tracing on 05/17/2019.  Lab Results  Component Value Date   WBC 3.3 (L) 12/11/2018   HGB 12.9 (L) 12/11/2018   HCT 41.2 12/11/2018   MCV 96.3 12/11/2018   PLT 153 12/11/2018    Lab Results  Component Value Date   NA 139 05/17/2019   K 5.0 05/17/2019   CL 106 05/17/2019   CO2 27 05/17/2019   BUN 19 05/17/2019   CREATININE 1.02 05/17/2019   GLUCOSE 91 05/17/2019   ALT 10 (L) 02/22/2015     No results found for: CHOL, HDL, LDLCALC, LDLDIRECT, TRIG, CHOLHDL  --------------------------------------------------------------------------------------------------  ASSESSMENT AND PLAN: Coronary artery disease with stable angina: [name] denies chest pain and shortness of breath.  He is tolerating current antianginal regimen of low-dose carvedilol and isosorbide mononitrate well, though BP is low-normal today.  Cardiac CTA in 06/2019 showed moderate LCx disease that was hemodynamically significant by CTFFR.  Given lack of symptoms, we will continue with current medical therapy, including aspirin, atorvastatin, carvedilol, and isosorbide mononitrate.  Pericardial calcification: Again noted on cardiac CTA.  This is likely a result of remote TB infection that [name] states was treated.  Echo shows some equivocal signs of constrictive physiology, but given lack of symptoms, no further intervention is recommended at this time.  Hypertension: Low-normal BP today (asymptomatic).  Continue current medications:  Hyperlipidemia: LDL at goal (<70) on last check with Dr. Baldemar Lenis in 06/2019.  Continue atorvastatin 10 mg daily.  Follow-up: Return to clinic in 6 months.  Nelva Bush, MD 08/21/2019 8:31 PM

## 2019-08-20 ENCOUNTER — Encounter: Payer: Self-pay | Admitting: Internal Medicine

## 2019-08-20 ENCOUNTER — Ambulatory Visit (INDEPENDENT_AMBULATORY_CARE_PROVIDER_SITE_OTHER): Payer: Medicare Other | Admitting: Internal Medicine

## 2019-08-20 ENCOUNTER — Other Ambulatory Visit: Payer: Self-pay

## 2019-08-20 VITALS — BP 102/52 | HR 60 | Ht 70.0 in | Wt 156.8 lb

## 2019-08-20 DIAGNOSIS — I25118 Atherosclerotic heart disease of native coronary artery with other forms of angina pectoris: Secondary | ICD-10-CM

## 2019-08-20 DIAGNOSIS — E785 Hyperlipidemia, unspecified: Secondary | ICD-10-CM | POA: Diagnosis not present

## 2019-08-20 DIAGNOSIS — I10 Essential (primary) hypertension: Secondary | ICD-10-CM

## 2019-08-20 DIAGNOSIS — I318 Other specified diseases of pericardium: Secondary | ICD-10-CM

## 2019-08-20 NOTE — Patient Instructions (Addendum)
Please call our office if you have new or worsening lightheadedness or dizziness.  COVID-19 Vaccine Information can be found at: [email or web address] For questions related to vaccine distribution or appointments, please email [email or web address] or call [phone].    Medication Instructions:  Your physician recommends that you continue on your current medications as directed. Please refer to the Current Medication list given to you today.  *If you need a refill on your cardiac medications before your next appointment, please call your pharmacy*  Lab Work: none If you have labs (blood work) drawn today and your tests are completely normal, you will receive your results only by: Marland Kitchen MyChart Message (if you have MyChart) OR . A paper copy in the mail If you have any lab test that is abnormal or we need to change your treatment, we will call you to review the results.  Testing/Procedures: none  Follow-Up: At [facility], you and your health needs are our priority.  As part of our continuing mission to provide you with exceptional heart care, we have created designated Provider Care Teams.  These Care Teams include your primary Cardiologist (physician) and Advanced Practice Providers (APPs -  Physician Assistants and Nurse Practitioners) who all work together to provide you with the care you need, when you need it.  Your next appointment:   6 month(s)  The format for your next appointment:   In Person  Provider:    You may see Yvonne Kendall, MD or one of the following Advanced Practice Providers on your designated Care Team:    Nicolasa Ducking, NP  Eula Listen, PA-C  Marisue Ivan, PA-C   COVID-19 Vaccine Information can be found at: [email or web address] For questions related to vaccine distribution or appointments, please email [email or web address] or  call [phone].

## 2019-08-21 ENCOUNTER — Encounter: Payer: Self-pay | Admitting: Internal Medicine

## 2019-12-09 ENCOUNTER — Other Ambulatory Visit: Payer: Self-pay | Admitting: Urology

## 2019-12-22 ENCOUNTER — Other Ambulatory Visit: Payer: Self-pay

## 2019-12-22 MED ORDER — ISOSORBIDE MONONITRATE ER 30 MG PO TB24: 15.0000 mg | ORAL_TABLET | Freq: Every day | ORAL | 1 refills | Status: DC

## 2020-01-01 IMAGING — CT CT ABDOMEN AND PELVIS WITHOUT AND WITH CONTRAST
2 of 12 series · 9 of 46 positions shown, 15 images · IV contrast (omnipaque)
Comparison: CT the abdomen and pelvis 12/11/2018.

CLINICAL DATA: 85-year-old male with history of gross hematuria.

EXAM:
CT ABDOMEN AND PELVIS WITHOUT AND WITH CONTRAST
TECHNIQUE: Multidetector CT imaging of the abdomen and pelvis was performed
following the standard protocol before and following the bolus
administration of intravenous contrast.
CONTRAST:  125mL OMNIPAQUE IOHEXOL 300 MG/ML  SOLN

[Series 5: cor without without pre · coronal · non-contrast · 0.70mm/px · 2 of 169 slices shown, 3 images]
[im 57/169  soft-tissue]
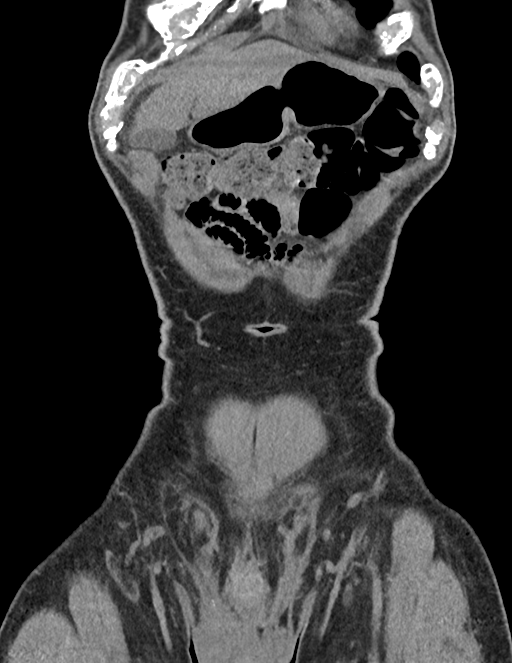
[im 57/169  bone]
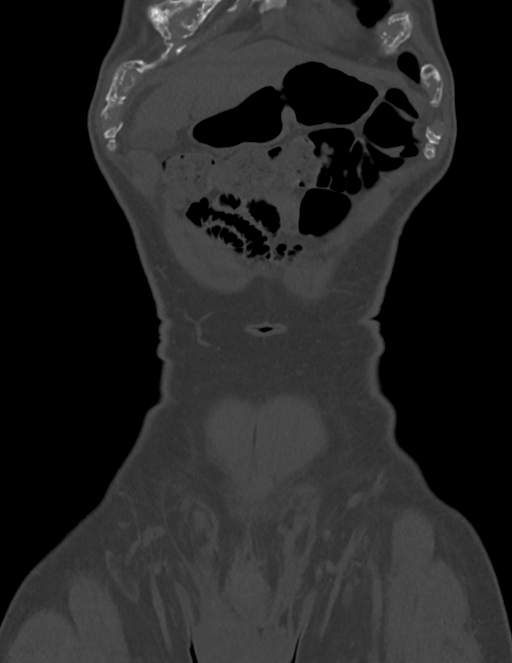
[im 113/169  soft-tissue]
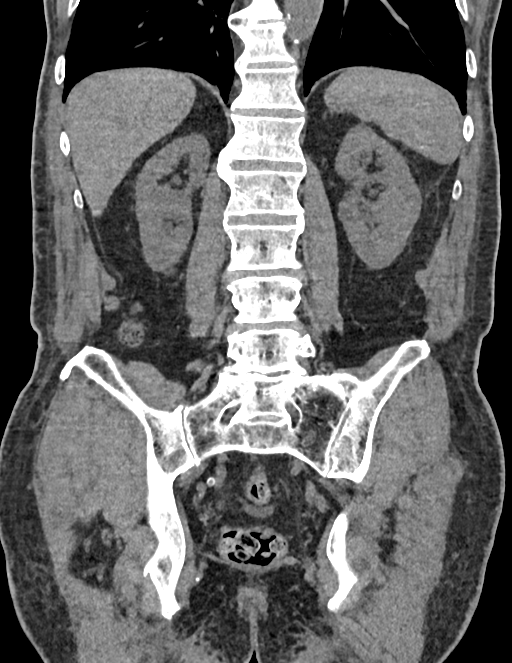

[Series 9: axial with hematuria with · axial · 0.70mm/px · z∈[-1648,-1308]mm · 7 of 92 slices shown, 12 images]
[im 12/92  soft-tissue]
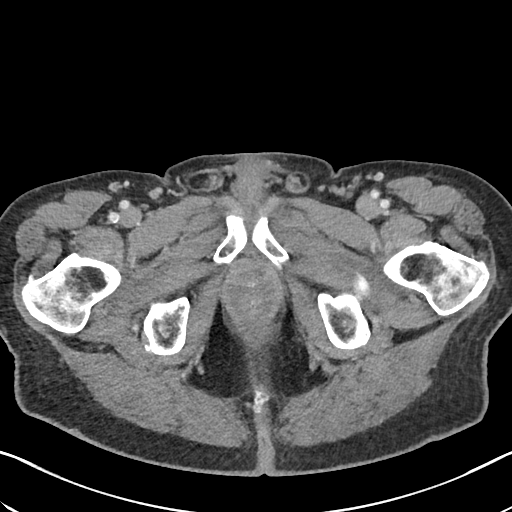
[im 12/92  bone]
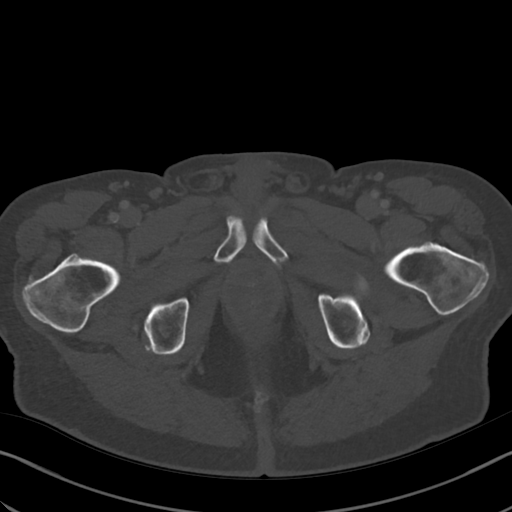
[im 23/92  soft-tissue]
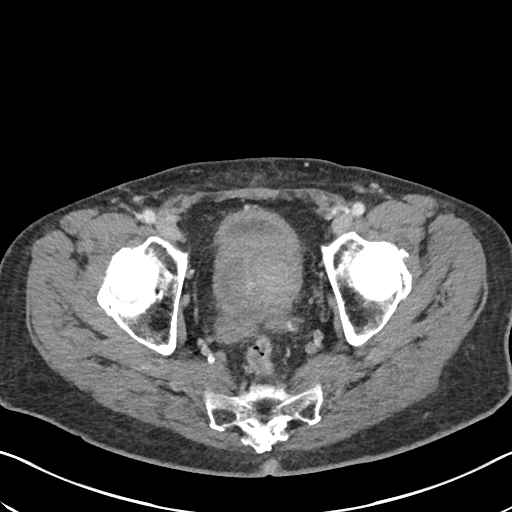
[im 35/92  soft-tissue]
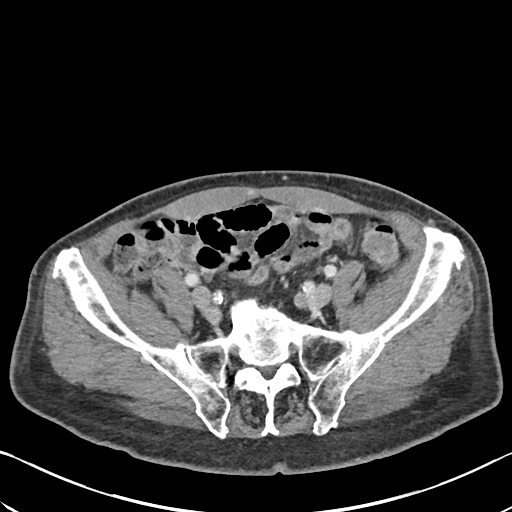
[im 46/92  soft-tissue]
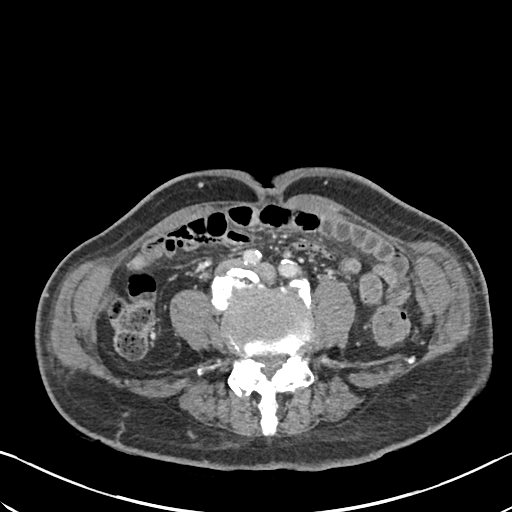
[im 46/92  lung]
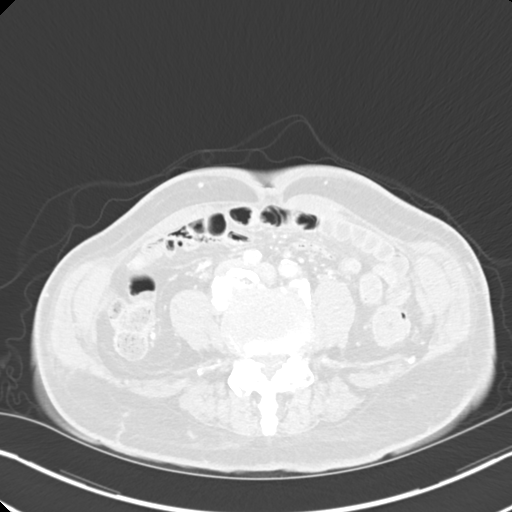
[im 57/92  soft-tissue]
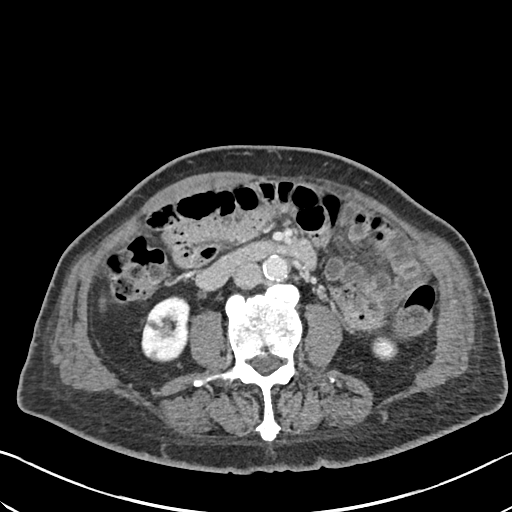
[im 57/92  lung]
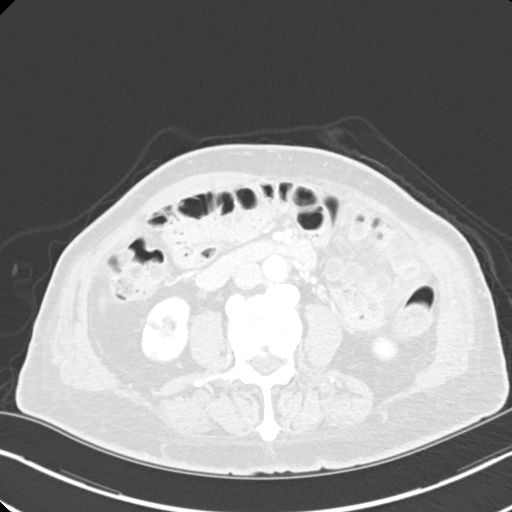
[im 69/92  soft-tissue]
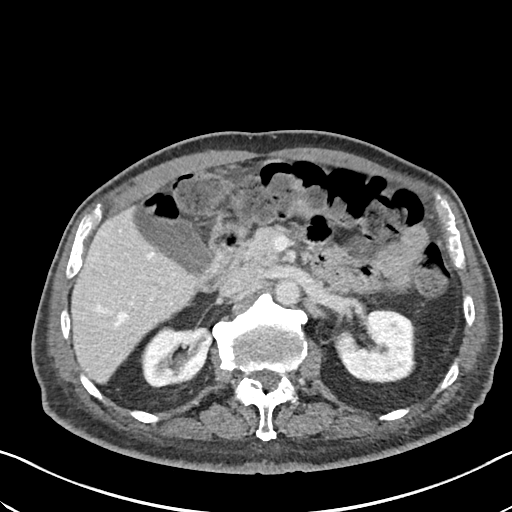
[im 69/92  lung]
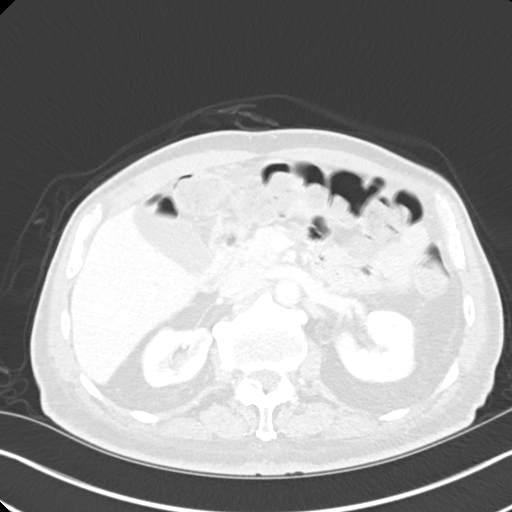
[im 80/92  soft-tissue]
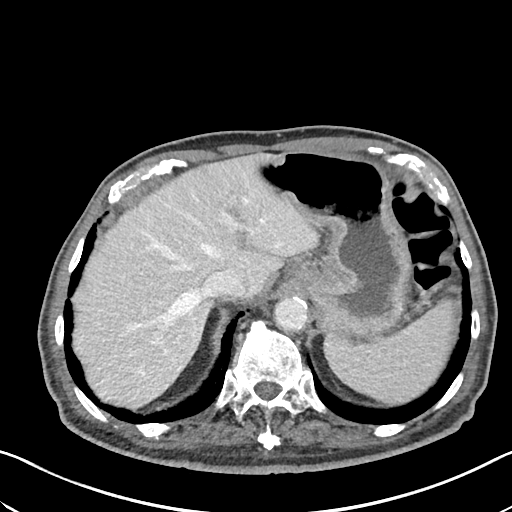
[im 80/92  lung]
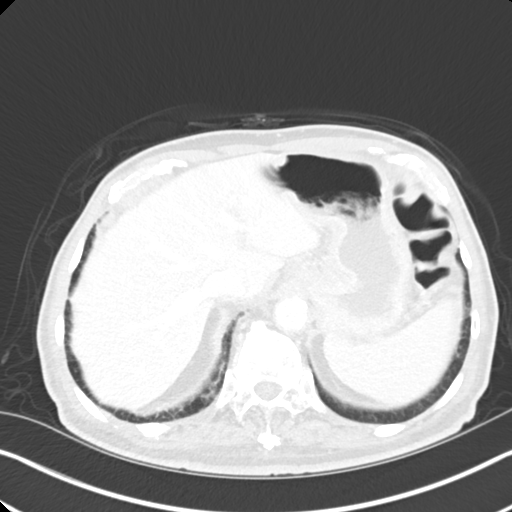

[9 of 46 positions shown; findings below may reference images not displayed]

FINDINGS: Lower chest: Extensive pericardial calcification most evident in the
atrioventricular grooves. Aortic atherosclerosis.

Hepatobiliary: No suspicious cystic or solid hepatic lesions. No
intra or extrahepatic biliary ductal dilatation. Gallbladder is
normal in appearance.

Pancreas: No pancreatic mass. No pancreatic ductal dilatation. No
pancreatic or peripancreatic fluid or inflammatory changes.

Spleen: Unremarkable.

Adrenals/Urinary Tract: No calcifications are identified within the
collecting system of either kidney, along the course of either
ureter, or within the lumen of the urinary bladder. No
hydroureteronephrosis. Several low-attenuation lesions in the right
kidney, compatible with simple cysts, largest of which measure up to
1.8 cm in the lower pole. Other subcentimeter low-attenuation
lesions in both kidneys are too small to definitively characterize,
but statistically likely to represent tiny cysts. No suspicious
renal lesions. Postcontrast delayed images demonstrate no definite
filling defect within the collecting system of either kidney, along
the course of either ureter, or within the lumen of the urinary
bladder to strongly suggest the presence of a urothelial neoplasm at
this time. Urinary bladder wall does appear thickened, likely
related to bladder outlet obstruction from the enlarged prostate
gland (discussed below). Right adrenal gland is normal in
appearance. Stable 1.7 cm left adrenal nodule measures 24 HU, but is
similar to prior study from 4314, presumably a benign lesions such
as a lipid poor adenoma.

Stomach/Bowel: Normal appearance of the stomach. No pathologic
dilatation of small bowel or colon. Normal appendix.

Vascular/Lymphatic: Aortic atherosclerosis, without evidence of
aneurysm or dissection in the abdominal or pelvic vasculature. No
lymphadenopathy noted in the abdomen or pelvis.

Reproductive: Prostate gland is severely enlarged with median lobe
hypertrophy, measuring 7.1 x 6.4 x 8.2 cm. Seminal vesicles are
unremarkable in appearance.

Other: No significant volume of ascites.  No pneumoperitoneum.

Musculoskeletal: There are no aggressive appearing lytic or blastic
lesions noted in the visualized portions of the skeleton.
IMPRESSION: 1. No definite source for hematuria identified on today's
examination.
2. Severe prostatomegaly with median lobe hypertrophy. This is
associated with circumferential thickening of the urinary bladder
wall which likely reflects bladder outlet obstruction.
3. Multiple low-attenuation lesions in the kidneys bilaterally. Many
of these are compatible with simple cysts. The smaller lesions are
too small to definitively characterize, but are also statistically
likely to represent cysts. Should the patient's hematuria persist or
worsen these could be definitively characterized with follow-up
nonemergent MRI of the abdomen with and without IV gadolinium if
clinically appropriate.
4. No urinary tract calculi.
5. Extensive pericardial calcification in the atrioventricular
grooves. Nonemergent outpatient referral to cardiology should be
considered for further evaluation to exclude the possibility of
constrictive physiology if clinically appropriate.
6. Additional incidental findings, as above.

## 2020-05-04 ENCOUNTER — Emergency Department: Payer: Medicare Other

## 2020-05-04 ENCOUNTER — Other Ambulatory Visit: Payer: Self-pay

## 2020-05-04 DIAGNOSIS — R002 Palpitations: Secondary | ICD-10-CM | POA: Diagnosis not present

## 2020-05-04 DIAGNOSIS — Z79899 Other long term (current) drug therapy: Secondary | ICD-10-CM | POA: Diagnosis not present

## 2020-05-04 DIAGNOSIS — Z20822 Contact with and (suspected) exposure to covid-19: Secondary | ICD-10-CM | POA: Insufficient documentation

## 2020-05-04 DIAGNOSIS — R519 Headache, unspecified: Secondary | ICD-10-CM | POA: Diagnosis not present

## 2020-05-04 DIAGNOSIS — R112 Nausea with vomiting, unspecified: Secondary | ICD-10-CM | POA: Diagnosis present

## 2020-05-04 DIAGNOSIS — J019 Acute sinusitis, unspecified: Secondary | ICD-10-CM | POA: Diagnosis not present

## 2020-05-04 DIAGNOSIS — E039 Hypothyroidism, unspecified: Secondary | ICD-10-CM | POA: Diagnosis not present

## 2020-05-04 DIAGNOSIS — Z7982 Long term (current) use of aspirin: Secondary | ICD-10-CM | POA: Diagnosis not present

## 2020-05-04 DIAGNOSIS — I2510 Atherosclerotic heart disease of native coronary artery without angina pectoris: Secondary | ICD-10-CM | POA: Diagnosis not present

## 2020-05-04 LAB — COMPREHENSIVE METABOLIC PANEL
ALT: 13 U/L (ref 0–44)
AST: 21 U/L (ref 15–41)
Albumin: 3.6 g/dL (ref 3.5–5.0)
Alkaline Phosphatase: 51 U/L (ref 38–126)
Anion gap: 10 (ref 5–15)
BUN: 14 mg/dL (ref 8–23)
CO2: 24 mmol/L (ref 22–32)
Calcium: 8.6 mg/dL — ABNORMAL LOW (ref 8.9–10.3)
Chloride: 105 mmol/L (ref 98–111)
Creatinine, Ser: 0.96 mg/dL (ref 0.61–1.24)
GFR calc Af Amer: >60 mL/min (ref >60)
GFR calc non Af Amer: >60 mL/min (ref >60)
Glucose, Bld: 99 mg/dL (ref 70–99)
Potassium: 4.5 mmol/L (ref 3.5–5.1)
Sodium: 139 mmol/L (ref 135–145)
Total Bilirubin: 1 mg/dL (ref 0.3–1.2)
Total Protein: 7.3 g/dL (ref 6.5–8.1)

## 2020-05-04 LAB — CBC
HCT: 40.1 % (ref 39.0–52.0)
Hemoglobin: 13 g/dL (ref 13.0–17.0)
MCH: 30.6 pg (ref 26.0–34.0)
MCHC: 32.4 g/dL (ref 30.0–36.0)
MCV: 94.4 fL (ref 80.0–100.0)
Platelets: 174 10*3/uL (ref 150–400)
RBC: 4.25 MIL/uL (ref 4.22–5.81)
RDW: 13.3 % (ref 11.5–15.5)
WBC: 2.8 10*3/uL — ABNORMAL LOW (ref 4.0–10.5)
nRBC: 0 % (ref 0.0–0.2)

## 2020-05-04 LAB — TROPONIN I (HIGH SENSITIVITY)
Troponin I (High Sensitivity): 3 ng/L (ref <18)
Troponin I (High Sensitivity): 3 ng/L (ref <18)

## 2020-05-04 NOTE — ED Triage Notes (Signed)
Pt in with co dizziness and palpitations since last night. States has also vomited since. No diarrhea, no hx of the same. Denies any recent illness or fever, states no cardiac history.

## 2020-05-05 ENCOUNTER — Emergency Department
Admission: EM | Admit: 2020-05-05 | Discharge: 2020-05-05 | Disposition: A | Discharge status: Home / Self Care | Payer: Medicare Other | Attending: Emergency Medicine | Admitting: Emergency Medicine

## 2020-05-05 DIAGNOSIS — R110 Nausea: Secondary | ICD-10-CM

## 2020-05-05 DIAGNOSIS — J019 Acute sinusitis, unspecified: Secondary | ICD-10-CM

## 2020-05-05 LAB — TSH: TSH: 1.184 u[IU]/mL (ref 0.350–4.500)

## 2020-05-05 LAB — T4, FREE: Free T4: 0.99 ng/dL (ref 0.61–1.12)

## 2020-05-05 LAB — LIPASE, BLOOD: Lipase: 25 U/L (ref 11–51)

## 2020-05-05 LAB — RESPIRATORY PANEL BY RT PCR (FLU A&B, COVID)
Influenza A by PCR: NEGATIVE
Influenza B by PCR: NEGATIVE
SARS Coronavirus 2 by RT PCR: NEGATIVE

## 2020-05-05 MED ORDER — AMOXICILLIN-POT CLAVULANATE 875-125 MG PO TABS
1.0000 | ORAL_TABLET | Freq: Once | ORAL | Status: AC
  Administered 2020-05-05: 1 via ORAL
  Filled 2020-05-05: qty 1

## 2020-05-05 MED ORDER — ONDANSETRON 4 MG PO TBDP: 4.0000 mg | ORAL_TABLET | Freq: Three times a day (TID) | ORAL | 0 refills | Status: DC | PRN

## 2020-05-05 MED ORDER — AMOXICILLIN-POT CLAVULANATE 875-125 MG PO TABS: 1.0000 | ORAL_TABLET | Freq: Two times a day (BID) | ORAL | 0 refills | Status: AC

## 2020-05-05 NOTE — ED Provider Notes (Signed)
[facility] Emergency Department Provider Note  Time seen: 12:58 PM  I have reviewed the triage vital signs and the nursing notes.   HISTORY  Chief Complaint Emesis and Palpitations   HPI Tyrone Keith is a 86 y.o. male with a past medical history of atrial fibrillation, hypertension, Graves' disease, presents to the emergency department for nausea as well as an episode of vomiting and headache.  According to the patient 2 days ago after standing up he felt somewhat dizzy and lightheaded, states shortly afterwards was nauseated and had episode of vomiting.  He states his wife felt his heart and felt like he was having palpitations although he denies feeling this.  Patient denies any chest pain or shortness of breath at any point.  Patient states yesterday he developed a mild headache above both of his eyes, so he came to the emergency department for evaluation.  Patient denies any fever cough or shortness of breath.  Has been vaccinated against Covid.  Currently the patient appears well after a prolonged 18-hour wait in the waiting room.  Denies any symptoms at this time.  Has had no further nausea or vomiting since arrival to the ED.   Past Medical History:  Diagnosis Date  . Abnormal weight loss   . Acute kidney failure (HCC)   . Atrial fibrillation (HCC)   . BPH (benign prostatic hyperplasia) 12/19/2014  . CAD (coronary artery disease)   . Elevated PSA   . Epididymitis   . Graves disease   . Graves disease   . Graves disease   . Hypertension   . Nocturia 12/19/2014  . Postoperative primary hypothyroidism     Patient Active Problem List   Diagnosis Date Noted  . Pericardial calcification 05/19/2019  . Chest pain 05/19/2019  . Shortness of breath 05/19/2019  . 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 BPH (benign prostatic hyperplasia) 02/09/2015  . Graves disease   . Abnormal weight loss 11/05/2014  . Acute kidney failure (HCC) 11/05/2014   . A-fib (HCC) 11/05/2014  . Benign fibroma of prostate 11/05/2014  . Arteriosclerosis of coronary artery 11/05/2014  . Abnormal prostate specific antigen 11/05/2014  . Epididymitis 11/05/2014  . 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 Essential hypertension 11/05/2014  . Excessive urination at night 11/05/2014  . Hypothyroidism, postop 11/05/2014  . 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 History of Graves' disease 05/19/2014  . CAD in native artery 05/03/2014  . Benign essential HTN 05/03/2014  . Hyperlipidemia LDL goal <70 05/03/2014  . Cataract, nuclear sclerotic senile 10/21/2011  . Keratitis, superficial 10/21/2011  . 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 EYE SURGERY    . TOTAL THYROIDECTOMY      Prior to Admission medications   Medication Sig Start Date End Date Taking? Authorizing Provider  acetaminophen (TYLENOL) 325 MG tablet Take 975 mg by mouth every 6 (six) hours as needed for mild pain or headache.    [provider]  aspirin EC 81 MG tablet Take 81 mg by mouth daily.    [provider]  atorvastatin (LIPITOR) 10 MG tablet Take 10 mg by mouth daily.    [provider]  carvedilol (COREG) 3.125 MG tablet Take 3.125 mg by mouth 2 (two) times daily.    [provider]  finasteride (PROSCAR) 5 MG tablet TAKE 1 TABLET BY MOUTH EVERY DAY 12/09/19   Sondra Come, MD  isosorbide mononitrate (IMDUR) 30 MG 24  hr tablet Take 0.5 tablets (15 mg total) by mouth daily. 12/22/19   End, Cristal Deer, MD  levothyroxine (SYNTHROID) 100 MCG tablet Take by mouth. 06/14/19 06/13/20  [provider]  tamsulosin (FLOMAX) 0.4 MG CAPS capsule TAKE ONE CAPSULE BY MOUTH EVERY DAY AFTER SUPPER 12/09/19   Sondra Come, MD    No Known Allergies  Family History  Problem Relation Age of Onset  . Prostate cancer Brother   . Heart disease Mother   . Bladder Cancer Neg Hx   . Kidney cancer Neg Hx      Social History Social History   Tobacco Use  . Smoking status: Never Smoker  . Smokeless tobacco: Never Used  Vaping Use  . Vaping Use: Never used  Substance Use Topics  . Alcohol use: No    Alcohol/week: 0.0 standard drinks  . Drug use: No    Review of Systems Constitutional: Negative for fever. Cardiovascular: Negative for chest pain. Respiratory: Negative for shortness of breath. Gastrointestinal: Negative for abdominal pain.  Positive for nausea vomiting yesterday, none since Genitourinary: Negative for urinary compaints Musculoskeletal: Negative for musculoskeletal complaints Neurological: Positive for headache yesterday, now resolved. All other ROS negative  ____________________________________________   PHYSICAL EXAM:  VITAL SIGNS: ED Triage Vitals  Enc Vitals Group     BP 05/04/20 1907 (!) 148/67     Pulse Rate 05/04/20 1907 65     Resp 05/04/20 1907 20     Temp 05/04/20 1910 (!) 97.3 F (36.3 C)     Temp Source 05/04/20 1910 Oral     SpO2 05/04/20 1907 100 %     Weight 05/04/20 1907 155 lb (70.3 kg)     Height 05/04/20 1907 5\' 10"  (1.778 m)     Head Circumference --      Peak Flow --      Pain Score 05/04/20 1907 0     Pain Loc --      Pain Edu? --      Excl. in GC? --     Constitutional: Alert and oriented. Well appearing and in no distress. Eyes: Normal exam ENT      Head: Normocephalic and atraumatic.      Mouth/Throat: Mucous membranes are moist. Cardiovascular: Normal rate, regular rhythm.  Respiratory: Normal respiratory effort without tachypnea nor retractions. Breath sounds are clear  Gastrointestinal: Soft and nontender. No distention.   Musculoskeletal: Nontender with normal range of motion in all extremities. Neurologic:  Normal speech and language. No gross focal neurologic deficits.  Equal grip strengths.  No pronator drift.  No cranial nerve deficits. Skin:  Skin is warm, dry and intact.  Psychiatric: Mood and affect are normal.    ____________________________________________    EKG  EKG viewed and interpreted by myself shows a normal sinus rhythm at 62 bpm with a narrow QRS, left axis deviation, largely normal intervals nonspecific ST changes.  ____________________________________________    RADIOLOGY  CT scan head is negative. Chest x-ray is negative.  ____________________________________________   INITIAL IMPRESSION / ASSESSMENT AND PLAN / ED COURSE  Pertinent labs & imaging results that were available during my care of the patient were reviewed by me and considered in my medical decision making (see chart for details).   Patient presents emergency department with an episode of dizziness headache nausea vomiting, his symptoms have since resolved currently asymptomatic.  Overall the patient appears well, no distress.  Patient has a reassuring physical exam, normal neurological exam.  CT scan is reassuring,  chest x-ray is negative EKG appears well.  Patient's lab work is reassuring including negative troponin x2, normal thyroid studies.  CT scan head does show what appears to be a possible sinus infection, patient states this is the area that he was having a headache as well.  We will cover with Augmentin as a precaution.  We will discharge with nausea medication to be used if needed.  I did offer Covid testing, patient wishes to be tested we will test for Covid prior to discharge.  Patient agreeable to plan of care.  I did discuss with the possible palpitations he should follow-up with his cardiologist.  Patient agreeable.  I discussed return precautions.  Tyrone Keith was evaluated in Emergency Department on 05/05/2020 for the symptoms described in the history of present illness. He was evaluated in the context of the global COVID-19 pandemic, which necessitated consideration that the patient might be at risk for infection with the SARS-CoV-2 virus that causes COVID-19. Institutional protocols and algorithms that  pertain to the evaluation of patients at risk for COVID-19 are in a state of rapid change based on information released by regulatory bodies including the CDC and federal and state organizations. These policies and algorithms were followed during the patient's care in the ED.  ____________________________________________   FINAL CLINICAL IMPRESSION(S) / ED DIAGNOSES  Sinus infection Nausea    Minna Antis, MD 05/05/20 1302

## 2020-05-05 NOTE — Discharge Instructions (Addendum)
As we discussed please follow-up with your primary care doctor as well as your cardiologist for recheck and discussion of possible Holter monitor.  Please take your antibiotics for their entire course for your sinus infection.  You have been prescribed nausea medication please use this as needed as prescribed.  Return to the emergency department for any return of significant nausea vomiting, any abdominal pain or chest pain, fever or any other symptom personally concerning to yourself.

## 2020-05-05 NOTE — ED Notes (Signed)
Pt waiting on wife to collect him for safe discharge.

## 2020-05-05 NOTE — ED Notes (Signed)
No answer from wife at this time.

## 2020-06-21 ENCOUNTER — Encounter: Payer: Self-pay | Admitting: Internal Medicine

## 2020-06-21 ENCOUNTER — Other Ambulatory Visit: Payer: Self-pay

## 2020-06-21 ENCOUNTER — Ambulatory Visit (INDEPENDENT_AMBULATORY_CARE_PROVIDER_SITE_OTHER): Payer: Medicare Other | Admitting: Internal Medicine

## 2020-06-21 VITALS — BP 134/60 | HR 54 | Ht 70.0 in | Wt 152.0 lb

## 2020-06-21 DIAGNOSIS — I25118 Atherosclerotic heart disease of native coronary artery with other forms of angina pectoris: Secondary | ICD-10-CM

## 2020-06-21 DIAGNOSIS — E785 Hyperlipidemia, unspecified: Secondary | ICD-10-CM | POA: Diagnosis not present

## 2020-06-21 DIAGNOSIS — I10 Essential (primary) hypertension: Secondary | ICD-10-CM | POA: Diagnosis not present

## 2020-06-21 DIAGNOSIS — I318 Other specified diseases of pericardium: Secondary | ICD-10-CM | POA: Diagnosis not present

## 2020-06-21 MED ORDER — ATORVASTATIN CALCIUM 20 MG PO TABS: 20.0000 mg | ORAL_TABLET | Freq: Every day | ORAL | 2 refills | Status: DC

## 2020-06-21 NOTE — Patient Instructions (Signed)
Medication Instructions:  Your physician has recommended you make the following change in your medication:  1- INCREASE Atorvastatin 20 mg by mouth daily.  *If you need a refill on your cardiac medications before your next appointment, please call your pharmacy*  Lab Work: Your physician recommends that you return for lab work in: 2 MONTHS AT THE MEDICAL MALL. - TO CHECK CHOLESTEROL, LIPID, ALT. - Please go to the [facility]. You will check in at the front desk to the right as you walk into the atrium. Valet Parking is offered if needed. - No appointment needed. You may go any day between 7 am and 6 pm. - You will need to be fasting. Please do not have anything to eat or drink after midnight the morning you have the lab work. You may only have water or black coffee with no cream or sugar.  If you have labs (blood work) drawn today and your tests are completely normal, you will receive your results only by: Marland Kitchen MyChart Message (if you have MyChart) OR . A paper copy in the mail If you have any lab test that is abnormal or we need to change your treatment, we will call you to review the results.  Testing/Procedures: none  Follow-Up: At [facility], you and your health needs are our priority.  As part of our continuing mission to provide you with exceptional heart care, we have created designated Provider Care Teams.  These Care Teams include your primary Cardiologist (physician) and Advanced Practice Providers (APPs -  Physician Assistants and Nurse Practitioners) who all work together to provide you with the care you need, when you need it.  We recommend signing up for the patient portal called "MyChart".  Sign up information is provided on this After Visit Summary.  MyChart is used to connect with patients for Virtual Visits (Telemedicine).  Patients are able to view lab/test results, encounter notes, upcoming appointments, etc.  Non-urgent messages can be sent to your provider as  well.   To learn more about what you can do with MyChart, go to [email or web address].    Your next appointment:   6 month(s)  The format for your next appointment:   In Person  Provider:   You may see Yvonne Kendall, MD or one of the following Advanced Practice Providers on your designated Care Team:    Nicolasa Ducking, NP  Eula Listen, PA-C  Marisue Ivan, PA-C  Cadence [place], [place]  Gillian Shields, NP

## 2020-06-21 NOTE — Progress Notes (Signed)
Follow-up Outpatient Visit Date: 06/21/2020  Primary Care Provider: Kandyce Rud, MD [phone] [address] [facility] - Family and Internal Medicine [place] [address]  Chief Complaint: Follow-up pericardial calcification and coronary artery disease  HPI:  [name] is a 86 y.o. male with history of coronary artery disease, pericardial calcification (incidentally noted), atrial fibrillation, hypertension, Graves' disease with postoperative hypothyroidism, and BPH, who presents for follow-up of pericardial calcification and coronary artery disease.  Today, [name] reports that he is doing well.  He denies chest pain, shortness of breath, edema, and palpitations.  He reports sporadic episodes of orthostatic lightheadedness when he has been bending over.  He denies syncope and falls.  --------------------------------------------------------------------------------------------------  Cardiovascular History & Procedures: Cardiovascular Problems:  Pericardial calcification  Chest pain with questionable history of coronary artery disease  Paroxysmal atrial fibrillation  Risk Factors:  Possible CAD, hypertension, male gender, and age greater than 30  Cath/PCI:  None available  CV Surgery:  None  EP Procedures and Devices:  None  Non-Invasive Evaluation(s):  TTE (08/12/2019): Normal LV size with LVEF 55-60%.  Grade 1 diastolic dysfunction.  Normal RV size and function.  Aortic sclerosis.  Mild MR.  Elevated CVP (~15 mmHg).  Cardiac CTA (05/26/2019): No significant disease involving the LAD and RCA.  Moderate ostial LCx disease that is hemodynamically significant by CTFFR (proximal LCx 0.8, mid LCx 0.75).  Pericardial calcification noted as well as fibrotic changes in the lungs.  Exercise myocardial perfusion stress test(09/08/2006): Normal myocardial perfusion without evidence of ischemia. LVEF 48%.  Recent CV Pertinent Labs: Lab Results  Component Value Date     INR 1.1 12/11/2018   K 4.5 05/04/2020   K 4.5 09/23/2012   BUN 14 05/04/2020   BUN 12 09/23/2012   CREATININE 0.96 05/04/2020   CREATININE 1.00 09/23/2012    Past medical and surgical history were reviewed and updated in EPIC.  Current Meds  Medication Sig  . acetaminophen (TYLENOL) 325 MG tablet Take 975 mg by mouth every 6 (six) hours as needed for mild pain or headache.  Marland Kitchen aspirin EC 81 MG tablet Take 81 mg by mouth daily.  Marland Kitchen atorvastatin (LIPITOR) 10 MG tablet Take 10 mg by mouth daily.  . carvedilol (COREG) 3.125 MG tablet Take 3.125 mg by mouth 2 (two) times daily.  . finasteride (PROSCAR) 5 MG tablet TAKE 1 TABLET BY MOUTH EVERY DAY  . isosorbide mononitrate (IMDUR) 30 MG 24 hr tablet Take 0.5 tablets (15 mg total) by mouth daily.  Marland Kitchen levothyroxine (SYNTHROID) 112 MCG tablet Take by mouth.  . ondansetron (ZOFRAN ODT) 4 MG disintegrating tablet Take 1 tablet (4 mg total) by mouth every 8 (eight) hours as needed for nausea or vomiting.  . tamsulosin (FLOMAX) 0.4 MG CAPS capsule TAKE ONE CAPSULE BY MOUTH EVERY DAY AFTER SUPPER   Current Facility-Administered Medications for the 06/21/20 encounter (Office Visit) with Keanu Lesniak, Cristal Deer, MD  Medication  . lidocaine (XYLOCAINE) 2 % jelly 1 application    Allergies: Patient has no known allergies.  Social History   Tobacco Use  . Smoking status: Never Smoker  . Smokeless tobacco: Never Used  Vaping Use  . Vaping Use: Never used  Substance Use Topics  . Alcohol use: No    Alcohol/week: 0.0 standard drinks  . Drug use: No    Family History  Problem Relation Age of Onset  . Prostate cancer Brother   . Heart disease Mother   . Bladder Cancer Neg Hx   .  Kidney cancer Neg Hx     Review of Systems: A 12-system review of systems was performed and was negative except as noted in the HPI.  --------------------------------------------------------------------------------------------------  Physical Exam: BP 134/60 (BP  Location: Left Arm, Patient Position: Sitting, Cuff Size: Normal)   Pulse (!) 54   Ht 5\' 10"  (1.778 m)   Wt 152 lb (68.9 kg)   SpO2 97%   BMI 21.81 kg/m   General:  NAD. Neck: No JVD or HJR. Lungs: Coarse breath sound with scattered rhonchi.  No wheezes or crackles. Heart: Bradycardic but regular with 2/6 systolic murmur. Abd: Soft, NT/ND. Ext: Trace pretibial edema bilaterally.  EKG: Sinus bradycardia (heart rate 54 bpm) with left axis deviation, low voltage, inferior Q waves, and poor R wave progression.  Compared with prior tracing from 05/04/2020, heart rate has decreased and poor R wave progression is now evident.  Lab Results  Component Value Date   WBC 2.8 (L) 05/04/2020   HGB 13.0 05/04/2020   HCT 40.1 05/04/2020   MCV 94.4 05/04/2020   PLT 174 05/04/2020    Lab Results  Component Value Date   NA 139 05/04/2020   K 4.5 05/04/2020   CL 105 05/04/2020   CO2 24 05/04/2020   BUN 14 05/04/2020   CREATININE 0.96 05/04/2020   GLUCOSE 99 05/04/2020   ALT 13 05/04/2020   Outside lipid panel (06/08/2020): Total cholesterol 150, triglycerides 54, HDL 57, LDL 82  --------------------------------------------------------------------------------------------------  ASSESSMENT AND PLAN: Coronary artery disease with stable angina: [name] remains asymptomatic in the setting of CT FFR positive single-vessel CAD involving the ostial LCx.  We will continue medical/antianginal therapy including aspirin, atorvastatin, carvedilol, and isosorbide mononitrate.  Pericardial calcification: Incidentally noted on prior imaging.  No symptoms of heart failure/constrictive pericarditis.  No further work-up recommended at this time.  Hypertension: Blood pressure upper normal today.  No medication changes today.  Hyperlipidemia: Lipid panel earlier this month with PCP notable for LDL 82.  Given history of CAD, I have recommended escalation of atorvastatin to 20 mg daily for target LDL less  than 70.  We will repeat a lipid panel and ALT in 2 months.  Follow-up: Return to clinic in 6 months.  Christell Constant, MD 06/21/2020 10:02 AM

## 2020-06-22 ENCOUNTER — Encounter: Payer: Self-pay | Admitting: Internal Medicine

## 2020-07-05 NOTE — Progress Notes (Deleted)
07/06/2020 10:31 AM   [name] 28-Sep-1932 [id number]  Referring provider: Kandyce Rud, MD [phone] [address] [facility] - Family and Internal Medicine [place],  [place] [address]  No chief complaint on file.   HPI: Mr. Cleckler is a 86 year old male who with a history of hematuria and BPH with LU TS who presents today for follow up.  History of hematuria (high risk) Non-smoker.  CTU in 12/2018 revealed no calcifications are identified within the collecting system of either kidney, along the course of either ureter, or within the lumen of the urinary bladder. No hydroureteronephrosis. Several low-attenuation lesions in the right kidney, compatible with simple cysts, largest of which measure up to 1.8 cm in the lower pole. Other subcentimeter low-attenuation lesions in both kidneys are too small to definitively characterize, but statistically likely to represent tiny cysts. No suspicious renal lesions. Postcontrast delayed images demonstrate no definite filling defect within the collecting system of either kidney, along the course of either ureter, or within the lumen of the urinary bladder to strongly suggest the presence of a urothelial neoplasm at this time. Urinary bladder wall does appear thickened, likely related to bladder outlet obstruction from the enlarged prostate gland (discussed below). Right adrenal gland is normal in appearance. Stable 1.7 cm left adrenal nodule measures 24 HU, but is similar to prior study from 2012, presumably a benign lesions such as a lipid poor adenoma.  His cystoscopy with Dr. Richardo Hanks in 03/2019 the prostate was massive with large median lobe.   BPH WITH LUTS  (prostate and/or bladder) IPSS score: ***   PVR: *** mL  Previous score: 12/2   Previous PVR: 1 mL  Major complaint(s):  Frequency, urgency and nocturia x few years. Denies any dysuria, hematuria or suprapubic pain.   Currently taking: tamsulosin 0.4 mg and finasteride 5 mg daily  Denies  any recent fevers, chills, nausea or vomiting.   Score:  1-7 Mild 8-19 Moderate 20-35 Severe    PMH: Past Medical History:  Diagnosis Date  . Abnormal weight loss   . Acute kidney failure (HCC)   . Atrial fibrillation (HCC)   . BPH (benign prostatic hyperplasia) 12/19/2014  . CAD (coronary artery disease)   . Elevated PSA   . Epididymitis   . Graves disease   . Graves disease   . Graves disease   . Hypertension   . Nocturia 12/19/2014  . Postoperative primary hypothyroidism     Surgical History: Past Surgical History:  Procedure Laterality Date  . EYE SURGERY    . TOTAL THYROIDECTOMY      Home Medications:  Allergies as of 07/06/2020   No Known Allergies     Medication List       Accurate as of July 05, 2020 10:31 AM. If you have any questions, ask your nurse or doctor.        acetaminophen 325 MG tablet Commonly known as: TYLENOL Take 975 mg by mouth every 6 (six) hours as needed for mild pain or headache.   aspirin EC 81 MG tablet Take 81 mg by mouth daily.   atorvastatin 20 MG tablet Commonly known as: LIPITOR Take 1 tablet (20 mg total) by mouth daily.   carvedilol 3.125 MG tablet Commonly known as: COREG Take 3.125 mg by mouth 2 (two) times daily.   finasteride 5 MG tablet Commonly known as: PROSCAR TAKE 1 TABLET BY MOUTH EVERY DAY   isosorbide mononitrate 30 MG 24 hr tablet Commonly known as:  IMDUR Take 0.5 tablets (15 mg total) by mouth daily.   levothyroxine 112 MCG tablet Commonly known as: SYNTHROID Take by mouth.   ondansetron 4 MG disintegrating tablet Commonly known as: Zofran ODT Take 1 tablet (4 mg total) by mouth every 8 (eight) hours as needed for nausea or vomiting.   tamsulosin 0.4 MG Caps capsule Commonly known as: FLOMAX TAKE ONE CAPSULE BY MOUTH EVERY DAY AFTER SUPPER       Allergies: No Known Allergies  Family History: Family History  Problem Relation Age of Onset  . Prostate cancer Brother   . Heart  disease Mother   . Bladder Cancer Neg Hx   . Kidney cancer Neg Hx     Social History:  reports that he has never smoked. He has never used smokeless tobacco. He reports that he does not drink alcohol and does not use drugs.  ROS: For pertinent review of systems please refer to history of present illness  Physical Exam: There were no vitals taken for this visit.  Constitutional:  Well nourished. Alert and oriented, No acute distress. HEENT: [place] AT, moist mucus membranes.  Trachea midline Cardiovascular: No clubbing, cyanosis, or edema. Respiratory: Normal respiratory effort, no increased work of breathing. GI: Abdomen is soft, non tender, non distended, no abdominal masses. Liver and spleen not palpable.  No hernias appreciated.  Stool sample for occult testing is not indicated.   GU: No CVA tenderness.  No bladder fullness or masses.  Patient with circumcised/uncircumcised phallus. ***Foreskin easily retracted***  Urethral meatus is patent.  No penile discharge. No penile lesions or rashes. Scrotum without lesions, cysts, rashes and/or edema.  Testicles are located scrotally bilaterally. No masses are appreciated in the testicles. Left and right epididymis are normal. Rectal: Patient with  normal sphincter tone. Anus and perineum without scarring or rashes. No rectal masses are appreciated. Prostate is approximately *** grams, *** nodules are appreciated. Seminal vesicles are normal. Skin: No rashes, bruises or suspicious lesions. Lymph: No inguinal adenopathy. Neurologic: Grossly intact, no focal deficits, moving all 4 extremities. Psychiatric: Normal mood and affect.   Pertinent imaging ***    Laboratory Data:  Assessment & Plan:    1. History of hematuria Hematuria work up completed in 12/2018 - findings positive for very large prostate with median lobe No report of gross hematuria  RTC in one year for UA - patient to report any gross hematuria in the interim    2. BPH with  LUTS IPSS score is 12/2, it is worsening Continue conservative management, avoiding bladder irritants and timed voiding's Continue tamsulosin 0.4 mg daily and finasteride 5 mg daily: refills given Keep follow up in November with Dr. Lonna Cobb    No follow-ups on file.  Michiel Cowboy, PA-C  [facility] [address] [place], [place] [address] [phone]

## 2020-07-06 ENCOUNTER — Ambulatory Visit: Payer: Medicare Other | Admitting: Urology

## 2020-07-06 DIAGNOSIS — N138 Other obstructive and reflux uropathy: Secondary | ICD-10-CM

## 2020-07-07 ENCOUNTER — Encounter: Payer: Self-pay | Admitting: Urology

## 2020-12-20 ENCOUNTER — Other Ambulatory Visit: Payer: Self-pay

## 2020-12-20 ENCOUNTER — Ambulatory Visit: Payer: Medicare Other | Admitting: Internal Medicine

## 2020-12-20 ENCOUNTER — Encounter: Payer: Self-pay | Admitting: Internal Medicine

## 2020-12-20 VITALS — BP 140/76 | HR 52 | Ht 69.5 in | Wt 152.0 lb

## 2020-12-20 DIAGNOSIS — I318 Other specified diseases of pericardium: Secondary | ICD-10-CM

## 2020-12-20 DIAGNOSIS — E785 Hyperlipidemia, unspecified: Secondary | ICD-10-CM

## 2020-12-20 DIAGNOSIS — I25118 Atherosclerotic heart disease of native coronary artery with other forms of angina pectoris: Secondary | ICD-10-CM | POA: Diagnosis not present

## 2020-12-20 DIAGNOSIS — I480 Paroxysmal atrial fibrillation: Secondary | ICD-10-CM

## 2020-12-20 DIAGNOSIS — I10 Essential (primary) hypertension: Secondary | ICD-10-CM

## 2020-12-20 MED ORDER — ASPIRIN EC 81 MG PO TBEC: 81.0000 mg | DELAYED_RELEASE_TABLET | Freq: Every day | ORAL | Status: AC

## 2020-12-20 MED ORDER — ATORVASTATIN CALCIUM 20 MG PO TABS: 20.0000 mg | ORAL_TABLET | Freq: Every day | ORAL | 2 refills | Status: DC

## 2020-12-20 NOTE — Progress Notes (Signed)
Follow-up Outpatient Visit Date: 12/20/2020  Primary Care Provider: Kandyce Rud, MD [phone] [facility] - Family and Internal Medicine [place] [address]  Chief Complaint: Follow-up coronary artery disease and pericardial calcification  HPI:  Tyrone Keith is a 87 y.o. male with history of coronary artery disease,pericardial calcification (incidentally noted),atrial fibrillation, hypertension, Graves' disease with postoperative hypothyroidism, and BPH, who presents for follow-up of coronary artery disease and pericardial calcification.  I last saw Tyrone Keith in 06/2020, at which time he was doing well other than rare episodes of orthostatic lightheadedness when bending over.  Given severe ostial LCx disease noted on coronary CTA, we elected to increase atorvastatin to 20 mg daily for target LDL less than 70.  Antianginal therapy with carvedilol and isosorbide mononitrate was continued.  Today, Tyrone Keith reports that he has been feeling well.  He gets tired at times when he does heavy exertion but denies shortness of breath, chest pain, palpitations, and lightheadedness.  He does not recall any specifics about his atrial fibrillation diagnosis.  Review of the chart demonstrates a single EKG obtained in 2008 demonstrating atrial fibrillation.  He does not recall ever having been on a blood thinner.  He is tolerating his medications well.  He has not had any bleeding.  He notes that he may have been off atorvastatin for a few days, having run out of his supply.  --------------------------------------------------------------------------------------------------  Cardiovascular History & Procedures: Cardiovascular Problems:  Pericardial calcification  Chest pain with questionable history of coronary artery disease  Paroxysmal atrial fibrillation  Risk Factors:  Possible CAD, hypertension, male gender, and age greater than 96  Cath/PCI:  None available  CV  Surgery:  None  EP Procedures and Devices:  None  Non-Invasive Evaluation(s):  TTE (08/12/2019): Normal LV size with LVEF 55-60%. Grade 1 diastolic dysfunction. Normal RV size and function. Aortic sclerosis. Mild MR. Elevated CVP (~15 mmHg).  Cardiac CTA (05/26/2019): No significant disease involving the LAD and RCA. Moderate ostial LCx disease that is hemodynamically significant by CTFFR (proximal LCx 0.8, mid LCx 0.75). Pericardial calcification noted as well as fibrotic changes in the lungs.  Exercise myocardial perfusion stress test(09/08/2006): Normal myocardial perfusion without evidence of ischemia. LVEF 48%.   Recent CV Pertinent Labs: Lab Results  Component Value Date   CHOL 177 12/20/2020   HDL 70 12/20/2020   LDLCALC 96 12/20/2020   TRIG 56 12/20/2020   CHOLHDL 2.5 12/20/2020   INR 1.1 12/11/2018   K 5.9 (H) 12/20/2020   K 4.5 09/23/2012   BUN 15 12/20/2020   BUN 12 09/23/2012   CREATININE 1.24 12/20/2020   CREATININE 1.00 09/23/2012    Past medical and surgical history were reviewed and updated in EPIC.  Current Meds  Medication Sig  . aspirin EC 81 MG tablet Take 1 tablet (81 mg total) by mouth daily. Swallow whole.  . carvedilol (COREG) 3.125 MG tablet Take 3.125 mg by mouth 2 (two) times daily.  . isosorbide mononitrate (IMDUR) 30 MG 24 hr tablet Take 0.5 tablets (15 mg total) by mouth daily.  Marland Kitchen levothyroxine (SYNTHROID) 112 MCG tablet Take 112 mcg by mouth daily before breakfast.  . [DISCONTINUED] atorvastatin (LIPITOR) 20 MG tablet Take 1 tablet (20 mg total) by mouth daily.    Allergies: Patient has no known allergies.  Social History   Tobacco Use  . Smoking status: Never Smoker  . Smokeless tobacco: Never Used  Vaping Use  . Vaping Use: Never used  Substance  Use Topics  . Alcohol use: No    Alcohol/week: 0.0 standard drinks  . Drug use: No    Family History  Problem Relation Age of Onset  . Prostate cancer Brother   . Heart  disease Mother   . Bladder Cancer Neg Hx   . Kidney cancer Neg Hx     Review of Systems: A 12-system review of systems was performed and was negative except as noted in the HPI.  --------------------------------------------------------------------------------------------------  Physical Exam: BP 140/76 (BP Location: Left Arm, Patient Position: Sitting, Cuff Size: Normal)   Pulse (!) 52   Ht 5' 9.5" (1.765 m)   Wt 152 lb (68.9 kg)   SpO2 97%   BMI 22.12 kg/m   General:  NAD. Neck: No JVD or HJR. Lungs: Clear to auscultation bilaterally without wheezes or crackles. Heart: Bradycardic but regular without murmurs, rubs, or gallops. Abdomen: Soft, nontender, nondistended. Extremities: No lower extremity edema.  EKG: Sinus bradycardia with left axis deviation, low voltage, and inferior infarct.  No significant change from prior tracing on 06/21/2020.  Lab Results  Component Value Date   WBC 3.0 (L) 12/20/2020   HGB 13.8 12/20/2020   HCT 41.8 12/20/2020   MCV 92 12/20/2020   PLT 185 12/20/2020    Lab Results  Component Value Date   NA 139 12/20/2020   K 5.9 (H) 12/20/2020   CL 101 12/20/2020   CO2 24 12/20/2020   BUN 15 12/20/2020   CREATININE 1.24 12/20/2020   GLUCOSE 99 12/20/2020   ALT 23 12/20/2020    Lab Results  Component Value Date   CHOL 177 12/20/2020   HDL 70 12/20/2020   LDLCALC 96 12/20/2020   TRIG 56 12/20/2020   CHOLHDL 2.5 12/20/2020    --------------------------------------------------------------------------------------------------  ASSESSMENT AND PLAN: Coronary artery disease with stable angina: Other than some vague fatigue with very strenuous activity, Tyrone Keith remains asymptomatic.  We will continue her current antianginal regimen of carvedilol and isosorbide mononitrate as well as secondary prevention with aspirin and atorvastatin.  We will check a lipid panel today to ensure appropriate response to atorvastatin (target LDL less than  70).  Pericardial calcification: No signs or symptoms of constrictive pericarditis.  Tyrone Keith reports a remote history of TB, which likely was the cause of his pericardial calcification.  No further work-up is recommended at this time.  Paroxysmal atrial fibrillation: Details surrounding his atrial fibrillation diagnosis are unclear.  EKGs reviewed today are notable for a single tracing in 2008 demonstrating atrial fibrillation.  Circumstances are unclear from the chart or when questioning the patient.  Given no evidence of recurrence, we have agreed to defer anticoagulation.  We will have Tyrone Keith begin aspirin 81 mg daily in the setting of his his coronary artery disease.  Hyperlipidemia: Continue atorvastatin 20 mg daily with lipid panel today to ensure appropriate response (goal LDL less than 70).  Follow-up: Return to clinic in 6 months.  Yvonne Kendall, MD 12/21/2020 4:22 PM

## 2020-12-20 NOTE — Patient Instructions (Addendum)
Medication Instructions:  - Your physician has recommended you make the following change in your medication:   1) RESTART aspirin 81 mg- take 1 tablet by mouth once daily   Samples Given: Aspirin 81 mg Lot: [id number] Exp: Jan 2024 # 6 bottles given  *If you need a refill on your cardiac medications before your next appointment, please call your pharmacy*   Lab Work: - Your physician recommends that you have lab work today: CMET/ Lipid/ CBC  If you have labs (blood work) drawn today and your tests are completely normal, you will receive your results only by: Marland Kitchen MyChart Message (if you have MyChart) OR . A paper copy in the mail If you have any lab test that is abnormal or we need to change your treatment, we will call you to review the results.   Testing/Procedures: - none ordered   Follow-Up: At [facility], you and your health needs are our priority.  As part of our continuing mission to provide you with exceptional heart care, we have created designated Provider Care Teams.  These Care Teams include your primary Cardiologist (physician) and Advanced Practice Providers (APPs -  Physician Assistants and Nurse Practitioners) who all work together to provide you with the care you need, when you need it.  We recommend signing up for the patient portal called "MyChart".  Sign up information is provided on this After Visit Summary.  MyChart is used to connect with patients for Virtual Visits (Telemedicine).  Patients are able to view lab/test results, encounter notes, upcoming appointments, etc.  Non-urgent messages can be sent to your provider as well.   To learn more about what you can do with MyChart, go to [email or web address].    Your next appointment:   6 month(s)  The format for your next appointment:   In Person  Provider:   You may see Yvonne Kendall, MD or one of the following Advanced Practice Providers on your designated Care Team:    Nicolasa Ducking, NP  Eula Listen, PA-C  Marisue Ivan, PA-C  Cadence [place], [place]  Gillian Shields, NP    Other Instructions n/a

## 2020-12-21 ENCOUNTER — Encounter: Payer: Self-pay | Admitting: Internal Medicine

## 2020-12-21 LAB — COMPREHENSIVE METABOLIC PANEL
ALT: 23 IU/L (ref 0–44)
AST: 39 IU/L (ref 0–40)
Albumin/Globulin Ratio: 1.1 — ABNORMAL LOW (ref 1.2–2.2)
Albumin: 3.9 g/dL (ref 3.6–4.6)
Alkaline Phosphatase: 55 IU/L (ref 44–121)
BUN/Creatinine Ratio: 12 (ref 10–24)
BUN: 15 mg/dL (ref 8–27)
Bilirubin Total: 0.4 mg/dL (ref 0.0–1.2)
CO2: 24 mmol/L (ref 20–29)
Calcium: 8.6 mg/dL (ref 8.6–10.2)
Chloride: 101 mmol/L (ref 96–106)
Creatinine, Ser: 1.24 mg/dL (ref 0.76–1.27)
Globulin, Total: 3.5 g/dL (ref 1.5–4.5)
Glucose: 99 mg/dL (ref 65–99)
Potassium: 5.9 mmol/L — ABNORMAL HIGH (ref 3.5–5.2)
Sodium: 139 mmol/L (ref 134–144)
Total Protein: 7.4 g/dL (ref 6.0–8.5)
eGFR: 57 mL/min/{1.73_m2} — ABNORMAL LOW (ref >59)

## 2020-12-21 LAB — CBC
Hematocrit: 41.8 % (ref 37.5–51.0)
Hemoglobin: 13.8 g/dL (ref 13.0–17.7)
MCH: 30.3 pg (ref 26.6–33.0)
MCHC: 33 g/dL (ref 31.5–35.7)
MCV: 92 fL (ref 79–97)
Platelets: 185 10*3/uL (ref 150–450)
RBC: 4.56 x10E6/uL (ref 4.14–5.80)
RDW: 13.9 % (ref 11.6–15.4)
WBC: 3 10*3/uL — ABNORMAL LOW (ref 3.4–10.8)

## 2020-12-21 LAB — LIPID PANEL
Chol/HDL Ratio: 2.5 ratio (ref 0.0–5.0)
Cholesterol, Total: 177 mg/dL (ref 100–199)
HDL: 70 mg/dL (ref >39)
LDL Chol Calc (NIH): 96 mg/dL (ref 0–99)
Triglycerides: 56 mg/dL (ref 0–149)
VLDL Cholesterol Cal: 11 mg/dL (ref 5–40)

## 2020-12-22 ENCOUNTER — Telehealth: Payer: Self-pay | Admitting: *Deleted

## 2020-12-22 ENCOUNTER — Other Ambulatory Visit: Payer: Self-pay

## 2020-12-22 ENCOUNTER — Other Ambulatory Visit
Admission: RE | Admit: 2020-12-22 | Discharge: 2020-12-22 | Disposition: A | Discharge status: Home / Self Care | Payer: Medicare Other | Attending: Internal Medicine | Admitting: Internal Medicine

## 2020-12-22 DIAGNOSIS — E785 Hyperlipidemia, unspecified: Secondary | ICD-10-CM | POA: Diagnosis present

## 2020-12-22 DIAGNOSIS — E875 Hyperkalemia: Secondary | ICD-10-CM

## 2020-12-22 LAB — ALT: ALT: 37 U/L (ref 0–44)

## 2020-12-22 LAB — LIPID PANEL
Cholesterol: 201 mg/dL — ABNORMAL HIGH (ref 0–200)
HDL: 71 mg/dL (ref >40)
LDL Cholesterol: 120 mg/dL — ABNORMAL HIGH (ref 0–99)
Total CHOL/HDL Ratio: 2.8 RATIO
Triglycerides: 52 mg/dL (ref <150)
VLDL: 10 mg/dL (ref 0–40)

## 2020-12-22 LAB — BASIC METABOLIC PANEL
Anion gap: 7 (ref 5–15)
BUN: 18 mg/dL (ref 8–23)
CO2: 28 mmol/L (ref 22–32)
Calcium: 8.7 mg/dL — ABNORMAL LOW (ref 8.9–10.3)
Chloride: 102 mmol/L (ref 98–111)
Creatinine, Ser: 1.29 mg/dL — ABNORMAL HIGH (ref 0.61–1.24)
GFR, Estimated: 54 mL/min — ABNORMAL LOW (ref >60)
Glucose, Bld: 94 mg/dL (ref 70–99)
Potassium: 4.8 mmol/L (ref 3.5–5.1)
Sodium: 137 mmol/L (ref 135–145)

## 2020-12-22 MED ORDER — ATORVASTATIN CALCIUM 40 MG PO TABS: 40.0000 mg | ORAL_TABLET | Freq: Every day | ORAL | 3 refills | Status: DC

## 2020-12-22 NOTE — Telephone Encounter (Signed)
-----   Message from Yvonne Kendall, MD sent at 12/21/2020  1:20 PM EDT ----- Please let Tyrone Keith know that his potassium is moderately elevated at 5.9.  His blood counts are stable.  He is not on any medications that would be expected to elevate his potassium.  I suggest that he drink some extra water and have a repeat BMP in the medical mall tomorrow.  His cholesterol is also suboptimally controlled with an LDL of 96 (goal less than 70).  I suggest that we increase his atorvastatin to 40 mg daily with repeat lipid panel and ALT in 3 months.

## 2020-12-22 NOTE — Telephone Encounter (Signed)
The patient has been notified of the result and verbalized understanding. All questions (if any) were answered. Sampson Goon, RN 12/22/2020 8:55 AM  Prescription and labs placed. Gave advisement to increase water intake this am and going get lab this afternoon. Will need to return to get more labs in mid August after increase in atorvastatin and needs to be fasting that day.  Verbalized understanding.

## 2020-12-25 ENCOUNTER — Telehealth: Payer: Self-pay

## 2020-12-25 DIAGNOSIS — E785 Hyperlipidemia, unspecified: Secondary | ICD-10-CM

## 2020-12-25 NOTE — Telephone Encounter (Signed)
-----   Message from Yvonne Kendall, MD sent at 12/25/2020  7:38 AM EDT ----- Please let Mr. Sigal know that his cholesterol is mildly elevated with a bad cholesterol (LDL) of 120 (goal < 70).  Please ask him to take atorvastatin 40 mg daily, as prescribed (he had been out for at least a few days before our recent visit).  We should recheck a lipid panel in ~6 weeks to see if further dose escalation is needed.  His kidney function is also mildly impaired, which can be seen with dehydration.  I encourage him to drink plenty of water to remain well-hydrated.

## 2020-12-25 NOTE — Telephone Encounter (Signed)
Patient made aware of lab results and Dr. Serita Kyle recommendation. Lab orders placed for 6 wks fasting labs [facility]. Patient verbalized understanding and voiced appreciation for the call.

## 2021-02-02 ENCOUNTER — Other Ambulatory Visit
Admission: RE | Admit: 2021-02-02 | Discharge: 2021-02-02 | Disposition: A | Discharge status: Home / Self Care | Payer: Medicare Other | Attending: Internal Medicine | Admitting: Internal Medicine

## 2021-02-02 DIAGNOSIS — E785 Hyperlipidemia, unspecified: Secondary | ICD-10-CM

## 2021-02-02 LAB — LIPID PANEL
Cholesterol: 214 mg/dL — ABNORMAL HIGH (ref 0–200)
HDL: 74 mg/dL (ref >40)
LDL Cholesterol: 124 mg/dL — ABNORMAL HIGH (ref 0–99)
Total CHOL/HDL Ratio: 2.9 RATIO
Triglycerides: 80 mg/dL (ref <150)
VLDL: 16 mg/dL (ref 0–40)

## 2021-02-02 LAB — HEPATIC FUNCTION PANEL
ALT: 87 U/L — ABNORMAL HIGH (ref 0–44)
AST: 106 U/L — ABNORMAL HIGH (ref 15–41)
Albumin: 3.9 g/dL (ref 3.5–5.0)
Alkaline Phosphatase: 43 U/L (ref 38–126)
Bilirubin, Direct: 0.1 mg/dL (ref 0.0–0.2)
Indirect Bilirubin: 0.8 mg/dL (ref 0.3–0.9)
Total Bilirubin: 0.9 mg/dL (ref 0.3–1.2)
Total Protein: 7.5 g/dL (ref 6.5–8.1)

## 2021-02-06 ENCOUNTER — Telehealth: Payer: Self-pay | Admitting: Internal Medicine

## 2021-02-06 DIAGNOSIS — R7989 Other specified abnormal findings of blood chemistry: Secondary | ICD-10-CM

## 2021-02-06 DIAGNOSIS — Z79899 Other long term (current) drug therapy: Secondary | ICD-10-CM

## 2021-02-06 NOTE — Telephone Encounter (Signed)
I spoke with the patient regarding his lab results. He voices understanding of these results. He confirms that he is currently taking atorvastatin 40 mg once daily (he pulled his medication bottle to verify).  I also advised him of Dr. Serita Kyle recommendations to: 1) Stop atorvastatin 2) Avoid alcohol (which he does not consume) 3) Avoid tylenol in excess (he rarely uses this) 4) Repeat a CMET in 1 month (he is aware we will call to remind him of this in 1 month  The patient stated he wanted to be certain he had his correct medications as he seemed somewhat unsure of these. He pulled his medication bottles and verified he currently has: 1) ASA 81 mg once daily 2) Coreg 3.125 mg BID 3) Atorvastatin 40 mg once daily  I advised that we also had: 1) Imdur 30 mg - 0.5 tablet once daily on his list (he confirms he does not have this in hand) 2) Levothyroxine 112 mcg- 1 tablet once daily (he confirms he does not have this in hand  I have advised him I will review with Dr. Okey Dupre to see if the patient should resume his imdur- he currently denies chest pain/ SOB. I have advised him to please call Dr. Pilar Plate office to question what dose of levothyroxine her should be taking. I think he may have run out of refills on both of these medications and just stopped taking them.  Will also confirm with Dr. Okey Dupre he wants to see the patient in November as previously planned.  After reviewing the patient's medications and recommendations as above multiple times, he did voice understanding. He is aware we will call back after further review with Dr. Okey Dupre and he is agreeable.   He was very appreciative for the call.

## 2021-02-06 NOTE — Telephone Encounter (Signed)
I think it is fine to defer starting isosorbide mononitrate, if Mr. Pollard has been off this medication and is not having any angina.  He should hold atorvastatin for 1 month as previously recommended and also reach out to his PCP for further guidance regarding his thyroid supplementation.  Thanks.  Chris Slyvester Latona

## 2021-02-06 NOTE — Telephone Encounter (Signed)
Yvonne Kendall, MD  02/06/2021  6:46 AM EDT      Please let Mr. Kading know that his cholesterol has actually risen since atorvastatin was increased in May.  Please confirm that he has been taking atorvastatin 40 mg daily.  His liver function tests are also mildly abnormal.  I recommend that Mr. Hitchens stop taking atorvastatin and that we recheck a CMP in 1 month.  He should avoid alcohol and minimize acetaminophen use in themeantime.

## 2021-02-08 NOTE — Telephone Encounter (Signed)
Called patient and spoke with his grandson who stated that he will pass on the instructions to the patient as recommended by Dr. Okey Dupre below. Lucila Maine was with his mother Agustin Cree who has permission per DPR on file.

## 2021-05-13 ENCOUNTER — Emergency Department: Payer: Medicare Other

## 2021-05-13 ENCOUNTER — Other Ambulatory Visit: Payer: Self-pay

## 2021-05-13 ENCOUNTER — Inpatient Hospital Stay
Admission: EM | Admit: 2021-05-13 | Discharge: 2021-05-16 | DRG: 193 | Disposition: A | Discharge status: Home / Self Care | Payer: Medicare Other | Attending: Internal Medicine | Admitting: Internal Medicine

## 2021-05-13 DIAGNOSIS — R531 Weakness: Secondary | ICD-10-CM | POA: Diagnosis present

## 2021-05-13 DIAGNOSIS — Z79899 Other long term (current) drug therapy: Secondary | ICD-10-CM | POA: Diagnosis not present

## 2021-05-13 DIAGNOSIS — Z8249 Family history of ischemic heart disease and other diseases of the circulatory system: Secondary | ICD-10-CM

## 2021-05-13 DIAGNOSIS — I2510 Atherosclerotic heart disease of native coronary artery without angina pectoris: Secondary | ICD-10-CM | POA: Diagnosis present

## 2021-05-13 DIAGNOSIS — J189 Pneumonia, unspecified organism: Secondary | ICD-10-CM | POA: Diagnosis present

## 2021-05-13 DIAGNOSIS — Z7982 Long term (current) use of aspirin: Secondary | ICD-10-CM

## 2021-05-13 DIAGNOSIS — N400 Enlarged prostate without lower urinary tract symptoms: Secondary | ICD-10-CM | POA: Diagnosis present

## 2021-05-13 DIAGNOSIS — Z23 Encounter for immunization: Secondary | ICD-10-CM | POA: Diagnosis present

## 2021-05-13 DIAGNOSIS — Z79890 Hormone replacement therapy (postmenopausal): Secondary | ICD-10-CM

## 2021-05-13 DIAGNOSIS — E43 Unspecified severe protein-calorie malnutrition: Secondary | ICD-10-CM | POA: Diagnosis present

## 2021-05-13 DIAGNOSIS — E890 Postprocedural hypothyroidism: Secondary | ICD-10-CM | POA: Diagnosis present

## 2021-05-13 DIAGNOSIS — D696 Thrombocytopenia, unspecified: Secondary | ICD-10-CM | POA: Diagnosis not present

## 2021-05-13 DIAGNOSIS — Z20822 Contact with and (suspected) exposure to covid-19: Secondary | ICD-10-CM | POA: Diagnosis present

## 2021-05-13 DIAGNOSIS — Z91199 Patient's noncompliance with other medical treatment and regimen due to unspecified reason: Secondary | ICD-10-CM

## 2021-05-13 DIAGNOSIS — E039 Hypothyroidism, unspecified: Secondary | ICD-10-CM

## 2021-05-13 DIAGNOSIS — Z6821 Body mass index (BMI) 21.0-21.9, adult: Secondary | ICD-10-CM | POA: Diagnosis not present

## 2021-05-13 DIAGNOSIS — E785 Hyperlipidemia, unspecified: Secondary | ICD-10-CM

## 2021-05-13 DIAGNOSIS — R001 Bradycardia, unspecified: Secondary | ICD-10-CM

## 2021-05-13 DIAGNOSIS — D61818 Other pancytopenia: Secondary | ICD-10-CM | POA: Diagnosis present

## 2021-05-13 DIAGNOSIS — I480 Paroxysmal atrial fibrillation: Secondary | ICD-10-CM

## 2021-05-13 DIAGNOSIS — Z9181 History of falling: Secondary | ICD-10-CM

## 2021-05-13 DIAGNOSIS — I10 Essential (primary) hypertension: Secondary | ICD-10-CM | POA: Diagnosis present

## 2021-05-13 LAB — TSH: TSH: 49 u[IU]/mL — ABNORMAL HIGH (ref 0.350–4.500)

## 2021-05-13 LAB — BASIC METABOLIC PANEL
Anion gap: 4 — ABNORMAL LOW (ref 5–15)
BUN: 19 mg/dL (ref 8–23)
CO2: 27 mmol/L (ref 22–32)
Calcium: 8.5 mg/dL — ABNORMAL LOW (ref 8.9–10.3)
Chloride: 107 mmol/L (ref 98–111)
Creatinine, Ser: 1.38 mg/dL — ABNORMAL HIGH (ref 0.61–1.24)
GFR, Estimated: 50 mL/min — ABNORMAL LOW (ref >60)
Glucose, Bld: 101 mg/dL — ABNORMAL HIGH (ref 70–99)
Potassium: 4.4 mmol/L (ref 3.5–5.1)
Sodium: 138 mmol/L (ref 135–145)

## 2021-05-13 LAB — RESP PANEL BY RT-PCR (FLU A&B, COVID) ARPGX2
Influenza A by PCR: NEGATIVE
Influenza B by PCR: NEGATIVE
SARS Coronavirus 2 by RT PCR: NEGATIVE

## 2021-05-13 LAB — CBC
HCT: 34 % — ABNORMAL LOW (ref 39.0–52.0)
Hemoglobin: 11.4 g/dL — ABNORMAL LOW (ref 13.0–17.0)
MCH: 32.4 pg (ref 26.0–34.0)
MCHC: 33.5 g/dL (ref 30.0–36.0)
MCV: 96.6 fL (ref 80.0–100.0)
Platelets: 134 10*3/uL — ABNORMAL LOW (ref 150–400)
RBC: 3.52 MIL/uL — ABNORMAL LOW (ref 4.22–5.81)
RDW: 16.4 % — ABNORMAL HIGH (ref 11.5–15.5)
WBC: 3.9 10*3/uL — ABNORMAL LOW (ref 4.0–10.5)
nRBC: 0 % (ref 0.0–0.2)

## 2021-05-13 LAB — TROPONIN I (HIGH SENSITIVITY)
Troponin I (High Sensitivity): 4 ng/L (ref <18)
Troponin I (High Sensitivity): 4 ng/L (ref <18)

## 2021-05-13 LAB — T4, FREE: Free T4: <0.25 ng/dL — ABNORMAL LOW (ref 0.61–1.12)

## 2021-05-13 MED ORDER — SODIUM CHLORIDE 0.9 % IV SOLN
500.0000 mg | INTRAVENOUS | Status: DC
Start: 2021-05-13 — End: 2021-05-15
  Administered 2021-05-14: 20:00:00 500 mg via INTRAVENOUS
  Filled 2021-05-13 (×2): qty 500

## 2021-05-13 MED ORDER — HYDROCOD POLST-CPM POLST ER 10-8 MG/5ML PO SUER: 5.0000 mL | Freq: Two times a day (BID) | ORAL | Status: DC | PRN

## 2021-05-13 MED ORDER — LEVOTHYROXINE SODIUM 100 MCG PO TABS
100.0000 ug | ORAL_TABLET | ORAL | Status: AC
  Administered 2021-05-13: 100 ug via ORAL
  Filled 2021-05-13: qty 1

## 2021-05-13 MED ORDER — PNEUMOCOCCAL VAC POLYVALENT 25 MCG/0.5ML IJ INJ
0.5000 mL | INJECTION | INTRAMUSCULAR | Status: AC
  Administered 2021-05-14: 0.5 mL via INTRAMUSCULAR
  Filled 2021-05-13: qty 0.5

## 2021-05-13 MED ORDER — ISOSORBIDE MONONITRATE ER 30 MG PO TB24
15.0000 mg | ORAL_TABLET | Freq: Every day | ORAL | Status: DC
  Administered 2021-05-14 – 2021-05-16 (×2): 15 mg via ORAL
  Filled 2021-05-13 (×2): qty 1

## 2021-05-13 MED ORDER — TRAZODONE HCL 50 MG PO TABS: 25.0000 mg | ORAL_TABLET | Freq: Every evening | ORAL | Status: DC | PRN

## 2021-05-13 MED ORDER — ONDANSETRON HCL 4 MG PO TABS: 4.0000 mg | ORAL_TABLET | Freq: Four times a day (QID) | ORAL | Status: DC | PRN

## 2021-05-13 MED ORDER — ASPIRIN EC 81 MG PO TBEC
81.0000 mg | DELAYED_RELEASE_TABLET | Freq: Every day | ORAL | Status: DC
  Administered 2021-05-14 – 2021-05-16 (×3): 81 mg via ORAL
  Filled 2021-05-13 (×3): qty 1

## 2021-05-13 MED ORDER — ENOXAPARIN SODIUM 40 MG/0.4ML IJ SOSY
40.0000 mg | PREFILLED_SYRINGE | INTRAMUSCULAR | Status: DC
  Administered 2021-05-14 – 2021-05-16 (×3): 40 mg via SUBCUTANEOUS
  Filled 2021-05-13 (×3): qty 0.4

## 2021-05-13 MED ORDER — ONDANSETRON HCL 4 MG/2ML IJ SOLN: 4.0000 mg | Freq: Four times a day (QID) | INTRAMUSCULAR | Status: DC | PRN

## 2021-05-13 MED ORDER — SODIUM CHLORIDE 0.9 % IV SOLN
500.0000 mg | Freq: Once | INTRAVENOUS | Status: AC
  Administered 2021-05-13: 500 mg via INTRAVENOUS
  Filled 2021-05-13: qty 500

## 2021-05-13 MED ORDER — ACETAMINOPHEN 325 MG PO TABS: 650.0000 mg | ORAL_TABLET | Freq: Four times a day (QID) | ORAL | Status: DC | PRN

## 2021-05-13 MED ORDER — SODIUM CHLORIDE 0.9 % IV SOLN
2.0000 g | INTRAVENOUS | Status: DC
  Administered 2021-05-14: 2 g via INTRAVENOUS
  Filled 2021-05-13 (×2): qty 20

## 2021-05-13 MED ORDER — LEVOTHYROXINE SODIUM 112 MCG PO TABS
112.0000 ug | ORAL_TABLET | Freq: Every day | ORAL | Status: DC
  Filled 2021-05-13: qty 1

## 2021-05-13 MED ORDER — ISOSORBIDE MONONITRATE ER 30 MG PO TB24: 15.0000 mg | ORAL_TABLET | Freq: Every day | ORAL | Status: DC

## 2021-05-13 MED ORDER — MAGNESIUM HYDROXIDE 400 MG/5ML PO SUSP
30.0000 mL | Freq: Every day | ORAL | Status: DC | PRN
  Filled 2021-05-13: qty 30

## 2021-05-13 MED ORDER — CEFTRIAXONE SODIUM 2 G IJ SOLR: 2.0000 g | INTRAMUSCULAR | Status: DC

## 2021-05-13 MED ORDER — SODIUM CHLORIDE 0.9 % IV SOLN
1.0000 g | INTRAVENOUS | Status: AC
  Administered 2021-05-14: 1 g via INTRAVENOUS
  Filled 2021-05-13: qty 10

## 2021-05-13 MED ORDER — LEVOTHYROXINE SODIUM 112 MCG PO TABS
112.0000 ug | ORAL_TABLET | Freq: Every day | ORAL | Status: DC
  Administered 2021-05-14 – 2021-05-16 (×3): 112 ug via ORAL
  Filled 2021-05-13 (×3): qty 1

## 2021-05-13 MED ORDER — GUAIFENESIN ER 600 MG PO TB12
600.0000 mg | ORAL_TABLET | Freq: Two times a day (BID) | ORAL | Status: DC
  Administered 2021-05-13 – 2021-05-16 (×6): 600 mg via ORAL
  Filled 2021-05-13 (×6): qty 1

## 2021-05-13 MED ORDER — INFLUENZA VAC A&B SA ADJ QUAD 0.5 ML IM PRSY
0.5000 mL | PREFILLED_SYRINGE | INTRAMUSCULAR | Status: AC
  Administered 2021-05-14: 0.5 mL via INTRAMUSCULAR
  Filled 2021-05-13 (×2): qty 0.5

## 2021-05-13 MED ORDER — SODIUM CHLORIDE 0.9 % IV SOLN
1.0000 g | Freq: Once | INTRAVENOUS | Status: AC
  Administered 2021-05-13: 1 g via INTRAVENOUS
  Filled 2021-05-13: qty 10

## 2021-05-13 MED ORDER — ACETAMINOPHEN 650 MG RE SUPP: 650.0000 mg | Freq: Four times a day (QID) | RECTAL | Status: DC | PRN

## 2021-05-13 MED ORDER — SODIUM CHLORIDE 0.9 % IV SOLN
INTRAVENOUS | Status: DC
Start: 2021-05-13 — End: 2021-05-16

## 2021-05-13 NOTE — ED Notes (Signed)
While speaking to the pt. I noticed some slurred speech. I asked the wife about this. She noticed a change in his speech about 2 weeks ago. She is concerned about a stroke as well. I will make MD aware.

## 2021-05-13 NOTE — ED Provider Notes (Signed)
[facility] Department Provider Note   ____________________________________________   I have reviewed the triage vital signs and the nursing notes.   HISTORY  Chief Complaint Weakness   History limited by: Poor historian, history obtained primarily from wife at bedside   HPI Tyrone Keith is a 87 y.o. male who presents to the emergency department today because of concerns for increased weakness.  Wife states that over the past few weeks she has noticed a change in the patient's status.  She states that he has become increasingly weak.  She states that he is now hardly able to walk at all.  Additionally during this time she has noticed significant weight loss.  He has also been complaining of being cold all the time.  Wife also states that he has had a cough.  Records reviewed. Per medical record review patient has a history of abnormal weight loss, graves disease.  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   BPH (benign prostatic hyperplasia) 02/09/2015   Graves disease    Abnormal weight loss 11/05/2014   Acute kidney failure (HCC) 11/05/2014   A-fib (HCC) 11/05/2014   Benign fibroma of prostate 11/05/2014   Arteriosclerosis of coronary artery 11/05/2014   Abnormal prostate specific antigen 11/05/2014   Epididymitis 11/05/2014   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   Essential hypertension 11/05/2014   Excessive urination  at night 11/05/2014   Hypothyroidism, postop 11/05/2014   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 Authorizing Provider  aspirin EC 81 MG tablet Take 1 tablet (81 mg total) by mouth daily. Swallow whole. 12/20/20   End, Cristal Deer, MD  carvedilol (COREG) 3.125 MG tablet Take 3.125 mg by mouth 2 (two) times daily.    [provider]  isosorbide mononitrate (IMDUR) 30 MG 24 hr tablet Take 0.5 tablets (15 mg total) by mouth daily. 12/22/19   End, Cristal Deer, MD  levothyroxine (SYNTHROID) 112 MCG tablet Take 112 mcg by mouth daily before breakfast. 06/15/20 06/15/21  [provider]    Allergies Patient has no known allergies.  Family History  Problem Relation Age of Onset   Prostate cancer Brother    Heart disease Mother    Bladder Cancer Neg Hx    Kidney cancer Neg Hx     Social History Social History   Tobacco Use   Smoking status: Never  Smokeless tobacco: Never  Vaping Use   Vaping Use: Never used  Substance Use Topics   Alcohol use: No    Alcohol/week: 0.0 standard drinks   Drug use: No    Review of Systems Constitutional: Positive for generalized weakness. Positive for weight loss.  Eyes: No visual changes. ENT: No sore throat. Cardiovascular: Denies chest pain. Respiratory: Positive for cough. Gastrointestinal: Positive for decreased oral intake. Genitourinary: Negative for dysuria. Musculoskeletal: Negative for back pain. Skin:  Negative for rash. Neurological: Positive for intermittent slurred speech.  ____________________________________________   PHYSICAL EXAM:  VITAL SIGNS: ED Triage Vitals  Enc Vitals Group     BP 05/13/21 1642 (!) 102/54     Pulse Rate 05/13/21 1642 (!) 55     Resp 05/13/21 1642 16     Temp 05/13/21 1642 97.7 F (36.5 C)     Temp Source 05/13/21 1642 Oral     SpO2 05/13/21 1642 100 %     Weight --      Height 05/13/21 1643 5\' 10"  (1.778 m)     Head Circumference --      Peak Flow --      Pain Score 05/13/21 1643 0   Constitutional: Alert and oriented.  Eyes: Conjunctivae are normal.  ENT      Head: Normocephalic and atraumatic.      Nose: No congestion/rhinnorhea.      Mouth/Throat: Mucous membranes are moist.      Neck: No stridor. Hematological/Lymphatic/Immunilogical: No cervical lymphadenopathy. Cardiovascular: Normal rate, regular rhythm.  No murmurs, rubs, or gallops.  Respiratory: Normal respiratory effort without tachypnea nor retractions. Occasional dry cough. Gastrointestinal: Soft and non tender. No rebound. No guarding.  Genitourinary: Deferred Musculoskeletal: Normal range of motion in all extremities. No lower extremity edema. Neurologic:  Some confusion. Moving all extremities. Sensation intact.  Skin:  Skin is warm, dry and intact. No rash noted. Psychiatric: Mood and affect are normal. Speech and behavior are normal. Patient exhibits appropriate insight and judgment.  ____________________________________________    LABS (pertinent positives/negatives)  BMP na 138, k 4.4, glu 101, cr 1.38 CBC wbc 3.9, hgb 11.4, plt 134 Trop hs 4 TSH 49 ____________________________________________   EKG  I, 07/13/21, attending physician, personally viewed and interpreted this EKG  EKG Time: 1645 Rate: 56 Rhythm: sinus bradycardia Axis: left axis deviation Intervals: qtc 401 QRS: low voltage ST changes: no st elevation Impression: abnormal  ekg\  ____________________________________________    RADIOLOGY  CT head No acute abnormality  CXR Bilateral lower lobe airspace opacities  ____________________________________________   PROCEDURES  Procedures  ____________________________________________   INITIAL IMPRESSION / ASSESSMENT AND PLAN / ED COURSE  Pertinent labs & imaging results that were available during my care of the patient were reviewed by me and considered in my medical decision making (see chart for details).   Patient presented to the emergency department today because of concerns for increasing weakness.  Additional complaints of weight loss, cold intolerance and decreased appetite.  Patient's work-up here is concerning for possible pneumonia.  Additionally patient's TSH is extremely elevated.  He does have a history of Graves' disease and is status post thyroidectomy.  Patient however states he has not been taking his thyroid medication recently.  Will plan on admission for treatment of pneumonia as well as reestablishing patient's thyroid medication.  ____________________________________________   FINAL CLINICAL IMPRESSION(S) / ED DIAGNOSES  Final diagnoses:  Weakness  Community acquired pneumonia, unspecified laterality     Note: This dictation was  prepared with [employer]. Any transcriptional errors that result from this process are unintentional     Phineas Semen, MD 05/13/21 2206

## 2021-05-13 NOTE — ED Triage Notes (Signed)
Patient to ER via POV with complaints of generalized weakness. Wife in triage with patient who states that pt has been gradually declining in capacity over several weeks. Wife also reports that patient has a doctors appointment next week but today patient has been so weak he is having difficulty ambulating. Patient also with intermittent periods of confusion over the last several weeks. Decreased appetite. At baseline, patient is independent.   Pt HOH.

## 2021-05-13 NOTE — ED Notes (Signed)
RN to bedside to introduce self to pt. Wife advised pt hasnt been eating, hasnt been himself for awhile. 3-4 months is how long its been going on.

## 2021-05-13 NOTE — H&P (Addendum)
Tyrone Keith   PATIENT NAME: Tyrone Keith    MR#:  [id number]  DATE OF BIRTH:  [birth year]  DATE OF ADMISSION:  05/13/2021  PRIMARY CARE PHYSICIAN: Kandyce Rud, MD   Patient is coming from: Home  REQUESTING/REFERRING PHYSICIAN: Phineas Semen, MD  CHIEF COMPLAINT:   Chief Complaint  Patient presents with  . Weakness    HISTORY OF PRESENT ILLNESS:  Tyrone Keith is a 87 y.o. African-American male with medical history significant for paroxysmal atrial fibrillation, coronary artery disease, Graves' disease and postoperative hypothyroidism, hypertension and BPH, presented to the ER with acute onset of generalized weakness.  The patient admitted to recent congested cough with inability to expectorate without significant wheezing or dyspnea.  Per his wife he has not been having good appetite over the last 4 to 5 months.  Lately he has been feeling cold all the time.  When asked about Synthroid he said he thought he was not supposed to take it.  His wife is not sure how long he has been off of it.  No chest pain or palpitations.  No nausea or vomiting or abdominal pain.  He has been having unsteady gait and occasional slurred speech for a while per his wife.  ED Course: When he came to the ER, blood pressure was 102/54 with a heart rate of 55 with otherwise normal vital signs.  Labs revealed a BUN of 19 and creatinine 1.38 and high 60 troponin I was 4 twice.  CBC showed mild leukopenia close to previous levels and anemia worse than previous levels in May.  Free T4 was less than 0.25 and TSH was significantly elevated at 49. EKG as reviewed by me : EKG showed sinus bradycardia with rate of 56 with left axis deviation and poor R wave progression and Q waves inferiorly. Imaging: Chest x-ray showed bilateral lower lobe airspace opacities concerning for pneumonia and small bilateral pleural effusions.  The patient was given IV Rocephin and Zithromax and his p.o. Synthroid.  He will be  admitted to a medical monitored bed for further evaluation and management. PAST MEDICAL HISTORY:   Past Medical History:  Diagnosis Date  . Abnormal weight loss   . Acute kidney failure (HCC)   . Atrial fibrillation (HCC)   . BPH (benign prostatic hyperplasia) 12/19/2014  . CAD (coronary artery disease)   . Elevated PSA   . Epididymitis   . Graves disease   . Graves disease   . Graves disease   . Hypertension   . Nocturia 12/19/2014  . Postoperative primary hypothyroidism     PAST SURGICAL HISTORY:   Past Surgical History:  Procedure Laterality Date  . EYE SURGERY    . TOTAL THYROIDECTOMY      SOCIAL HISTORY:   Social History   Tobacco Use  . Smoking status: Never  . Smokeless tobacco: Never  Substance Use Topics  . Alcohol use: No    Alcohol/week: 0.0 standard drinks    FAMILY HISTORY:   Family History  Problem Relation Age of Onset  . Prostate cancer Brother   . Heart disease Mother   . Bladder Cancer Neg Hx   . Kidney cancer Neg Hx     DRUG ALLERGIES:  No Known Allergies  REVIEW OF SYSTEMS:   ROS As per history of present illness. All pertinent systems were reviewed above. Constitutional, HEENT, cardiovascular, respiratory, GI, GU, musculoskeletal, neuro, psychiatric, endocrine, integumentary and hematologic systems were reviewed and are otherwise negative/unremarkable except for  positive findings mentioned above in the HPI.   MEDICATIONS AT HOME:   Prior to Admission medications   Medication Sig Start Date End Date Taking? Authorizing Provider  aspirin EC 81 MG tablet Take 1 tablet (81 mg total) by mouth daily. Swallow whole. 12/20/20   End, Cristal Deer, MD  carvedilol (COREG) 3.125 MG tablet Take 3.125 mg by mouth 2 (two) times daily.    [provider]  isosorbide mononitrate (IMDUR) 30 MG 24 hr tablet Take 0.5 tablets (15 mg total) by mouth daily. 12/22/19   End, Cristal Deer, MD  levothyroxine (SYNTHROID) 112 MCG tablet Take 112 mcg by  mouth daily before breakfast. 06/15/20 06/15/21  [provider]      VITAL SIGNS:  Blood pressure 138/61, pulse (!) 51, temperature 97.7 F (36.5 C), temperature source Oral, resp. rate 18, height 5\' 10"  (1.778 m), SpO2 100 %.  PHYSICAL EXAMINATION:  Physical Exam  GENERAL:  87 y.o.-year-old African-American male patient lying in the bed with no acute distress.  He was fairly slow to answer questions with no slurred speech. EYES: Pupils equal, round, reactive to light and accommodation. No scleral icterus. Extraocular muscles intact.  HEENT: Head atraumatic, normocephalic. Oropharynx and nasopharynx clear.  NECK:  Supple, no jugular venous distention. No thyroid enlargement, no tenderness.  LUNGS: Diminished bibasilar breath sounds with bibasal crackles.  No use of accessory muscles of respiration.  CARDIOVASCULAR: Regular rate and rhythm, S1, S2 normal. No murmurs, rubs, or gallops.  ABDOMEN: Soft, nondistended, nontender. Bowel sounds present. No organomegaly or mass.  EXTREMITIES: No pedal edema, cyanosis, or clubbing.  NEUROLOGIC: Cranial nerves II through XII are intact. Muscle strength 5/5 in all extremities. Sensation intact. Gait not checked.  PSYCHIATRIC: The patient is alert and oriented x 3.  Normal affect and good eye contact. SKIN: No obvious rash, lesion, or ulcer.   LABORATORY PANEL:   CBC Recent Labs  Lab 05/13/21 1715  WBC 3.9*  HGB 11.4*  HCT 34.0*  PLT 134*   ------------------------------------------------------------------------------------------------------------------  Chemistries  Recent Labs  Lab 05/13/21 1715  NA 138  K 4.4  CL 107  CO2 27  GLUCOSE 101*  BUN 19  CREATININE 1.38*  CALCIUM 8.5*   ------------------------------------------------------------------------------------------------------------------  Cardiac Enzymes No results for input(s): TROPONINI in the last 168  hours. ------------------------------------------------------------------------------------------------------------------  RADIOLOGY:  CT Head Wo Contrast  Result Date: 05/13/2021 CLINICAL DATA:  Weakness.  Altered mental status. EXAM: CT HEAD WITHOUT CONTRAST TECHNIQUE: Contiguous axial images were obtained from the base of the skull through the vertex without intravenous contrast. COMPARISON:  May 04, 2020 FINDINGS: Brain: No evidence of acute infarction, hemorrhage, hydrocephalus, extra-axial collection or mass lesion/mass effect. Moderate deep white matter microangiopathy. Vascular: No hyperdense vessel or unexpected calcification. Skull: Normal. Negative for fracture or focal lesion. Sinuses/Orbits: Post broad mucosal thickening of the ethmoid sinuses, right maxillary and left sphenoid sinuses. Other: None. IMPRESSION: 1. No acute intracranial abnormality. 2. Chronic sinusitis. Electronically Signed   By: May 06, 2020 M.D.   On: 05/13/2021 18:50   DG Chest Portable 1 View  Result Date: 05/13/2021 CLINICAL DATA:  , Generalized weakness EXAM: PORTABLE CHEST 1 VIEW COMPARISON:  05/04/2020 FINDINGS: Bilateral lower lobe airspace opacities are noted. Small bilateral effusions. Heart is normal size. Aortic atherosclerosis. No acute bony abnormality. IMPRESSION: Bilateral lower lobe airspace opacities concerning for pneumonia. Small bilateral effusions. Electronically Signed   By: 05/06/2020 M.D.   On: 05/13/2021 18:31      IMPRESSION AND PLAN:  Active  Problems:   CAP (community acquired pneumonia)  1.  Bibasal community-acquired pneumonia, likely bacterial. - The patient will be admitted to a medical monitored bed. - We will continue antibiotic therapy with IV Rocephin and Zithromax. - Mucolytic therapy will be provided. - We will follow blood and sputum culture as well as pneumonia antigens.  2.  Severe hypothyroidism, likely secondary to noncompliance.  The patient has been  having cold intolerance, slow speech, generalized weakness with unsteady gait. - The patient will be given additional dose of Synthroid tonight and will continue his same dose from tomorrow. - PT consult will be obtained.  3.  Sinus bradycardia. - This like secondary to his hypothyroidism. - We will hold off Coreg for bradycardia.  4.  Dyslipidemia. - We will continue statin therapy.  5.  Coronary artery disease. - We will continue aspirin and statin therapy while holding off Imdur while he is bradycardic.  6.  History of paroxysmal atrial fibrillation. - He is currently in normal sinus rhythm. - He is a fall risk at this time for anticoagulation.  DVT prophylaxis: Lovenox. Code Status: full code.  This was discussed with him and his wife.  They will be thinking about it. Family Communication:  The plan of care was discussed in details with the patient (and family). I answered all questions. The patient agreed to proceed with the above mentioned plan. Further management will depend upon hospital course. Disposition Plan: Back to previous home environment Consults called: none. All the records are reviewed and case discussed with ED provider.  Status is: Inpatient  Remains inpatient appropriate because:Ongoing diagnostic testing needed not appropriate for outpatient work up, Unsafe d/c plan, IV treatments appropriate due to intensity of illness or inability to take PO, and Inpatient level of care appropriate due to severity of illness  Dispo: The patient is from: Home              Anticipated d/c is to: Home              Patient currently is not medically stable to d/c.   Difficult to place patient No   TOTAL TIME TAKING CARE OF THIS PATIENT: 55 minutes.    Hannah Beat M.D on 05/13/2021 at 8:08 PM  [facility]   From 7 PM-7 AM, contact night-coverage [email or web address]  CC: Primary care physician; Kandyce Rud, MD

## 2021-05-14 DIAGNOSIS — I480 Paroxysmal atrial fibrillation: Secondary | ICD-10-CM

## 2021-05-14 DIAGNOSIS — J189 Pneumonia, unspecified organism: Secondary | ICD-10-CM | POA: Diagnosis not present

## 2021-05-14 DIAGNOSIS — D696 Thrombocytopenia, unspecified: Secondary | ICD-10-CM | POA: Diagnosis not present

## 2021-05-14 LAB — URINALYSIS, COMPLETE (UACMP) WITH MICROSCOPIC
Bacteria, UA: NONE SEEN
Bilirubin Urine: NEGATIVE
Glucose, UA: NEGATIVE mg/dL
Hgb urine dipstick: NEGATIVE
Ketones, ur: NEGATIVE mg/dL
Leukocytes,Ua: NEGATIVE
Nitrite: NEGATIVE
Protein, ur: NEGATIVE mg/dL
Specific Gravity, Urine: 1.024 (ref 1.005–1.030)
pH: 5 (ref 5.0–8.0)

## 2021-05-14 LAB — BASIC METABOLIC PANEL
Anion gap: 3 — ABNORMAL LOW (ref 5–15)
BUN: 18 mg/dL (ref 8–23)
CO2: 28 mmol/L (ref 22–32)
Calcium: 8.1 mg/dL — ABNORMAL LOW (ref 8.9–10.3)
Chloride: 108 mmol/L (ref 98–111)
Creatinine, Ser: 1.16 mg/dL (ref 0.61–1.24)
GFR, Estimated: >60 mL/min (ref >60)
Glucose, Bld: 81 mg/dL (ref 70–99)
Potassium: 4.6 mmol/L (ref 3.5–5.1)
Sodium: 139 mmol/L (ref 135–145)

## 2021-05-14 LAB — CBC
HCT: 31.5 % — ABNORMAL LOW (ref 39.0–52.0)
Hemoglobin: 10.5 g/dL — ABNORMAL LOW (ref 13.0–17.0)
MCH: 31.9 pg (ref 26.0–34.0)
MCHC: 33.3 g/dL (ref 30.0–36.0)
MCV: 95.7 fL (ref 80.0–100.0)
Platelets: 118 10*3/uL — ABNORMAL LOW (ref 150–400)
RBC: 3.29 MIL/uL — ABNORMAL LOW (ref 4.22–5.81)
RDW: 16 % — ABNORMAL HIGH (ref 11.5–15.5)
WBC: 2.9 10*3/uL — ABNORMAL LOW (ref 4.0–10.5)
nRBC: 0 % (ref 0.0–0.2)

## 2021-05-14 LAB — STREP PNEUMONIAE URINARY ANTIGEN: Strep Pneumo Urinary Antigen: NEGATIVE

## 2021-05-14 LAB — PROCALCITONIN: Procalcitonin: <0.1 ng/mL

## 2021-05-14 MED ORDER — ENSURE ENLIVE PO LIQD
237.0000 mL | Freq: Two times a day (BID) | ORAL | Status: DC
  Administered 2021-05-14: 237 mL via ORAL

## 2021-05-14 NOTE — Evaluation (Signed)
Occupational Therapy Evaluation Patient Details Name: Tyrone Keith MRN: [id number] DOB: Jun 22, 1933 Today's Date: 05/14/2021   History of Present Illness Pt is an 87 y.o. male presenting to hospital with concerns of increased weakness and difficulty walking; also significant weight loss and c/o being cold all the time; also has had a cough; some slurred speech noted. (-) CT head for acute intracranial abnormality.  Pt admitted with bibasal community acquired PNA (likely bacterial), severe hypothyroidism (likely secondary to noncompliance), and sinus bradycardia.  PMH includes acute kidney failure, a-fib, CAD, Graves disease, htn, nocturia, chest pain, h/o eye surgery, s/p thyroidectomy.   Clinical Impression   Patient presenting with decreased Ind in self care, balance, functional mobility/transfer, endurance, and safety awareness. Patient reports being independent at baseline and living at home with wife PTA. Patient currently functioning at min guard for self care tasks and functional mobility with use of RW. Patient will benefit from acute OT to increase overall independence in the areas of ADLs, functional mobility, and safety awareness in order to safely discharge home with family.     Recommendations for follow up therapy are one component of a multi-disciplinary discharge planning process, led by the attending physician.  Recommendations may be updated based on patient status, additional functional criteria and insurance authorization.   Follow Up Recommendations  No OT follow up;Supervision - Intermittent    Equipment Recommendations  None recommended by OT       Precautions / Restrictions Precautions Precautions: Fall Restrictions Weight Bearing Restrictions: No      Mobility Bed Mobility Overal bed mobility: Modified Independent             General bed mobility comments: Pt seated in recliner chair    Transfers Overall transfer level: Needs assistance Equipment  used: Rolling walker (2 wheeled) Transfers: Sit to/from Stand Sit to Stand: Min guard         General transfer comment: mild increased effort to stand up to RW    Balance Overall balance assessment: Needs assistance Sitting-balance support: No upper extremity supported;Feet supported Sitting balance-Leahy Scale: Good Sitting balance - Comments: steady sitting reaching within BOS   Standing balance support: No upper extremity supported;During functional activity Standing balance-Leahy Scale: Fair Standing balance comment: mildly unsteady ambulating without UE support                           ADL either performed or assessed with clinical judgement   ADL Overall ADL's : Needs assistance/impaired                                       General ADL Comments: supervision - min guard for balance with self care tasks without use of AD.     Vision Patient Visual Report: No change from baseline              Pertinent Vitals/Pain Pain Assessment: No/denies pain     Hand Dominance Right   Extremity/Trunk Assessment Upper Extremity Assessment Upper Extremity Assessment: Generalized weakness   Lower Extremity Assessment Lower Extremity Assessment: Generalized weakness   Cervical / Trunk Assessment Cervical / Trunk Assessment: Normal   Communication Communication Communication: HOH   Cognition Arousal/Alertness: Awake/alert Behavior During Therapy: WFL for tasks assessed/performed Overall Cognitive Status: No family/caregiver present to determine baseline cognitive functioning  General Comments: Pt is alert and oriented x 4 but requires increased time to process information. He does follow commands.              Home Living Family/patient expects to be discharged to:: Private residence Living Arrangements: Spouse/significant other Available Help at Discharge: Family Type of Home: House Home  Access: Stairs to enter Secretary/administrator of Steps: 5 Entrance Stairs-Rails: Right;Left Home Layout: One level               Home Equipment: None          Prior Functioning/Environment Level of Independence: Independent        Comments: Pt reports no recent falls        OT Problem List: Decreased strength;Decreased activity tolerance;Impaired balance (sitting and/or standing);Decreased safety awareness;Decreased knowledge of use of DME or AE;Decreased knowledge of precautions;Cardiopulmonary status limiting activity      OT Treatment/Interventions: Self-care/ADL training;Therapeutic exercise;Therapeutic activities;Energy conservation;Cognitive remediation/compensation;DME and/or AE instruction;Patient/family education;Balance training    OT Goals(Current goals can be found in the care plan section) Acute Rehab OT Goals Patient Stated Goal: to go home OT Goal Formulation: With patient Time For Goal Achievement: 05/28/21 Potential to Achieve Goals: Good ADL Goals Pt Will Perform Grooming: with modified independence;standing Pt Will Perform Lower Body Dressing: with modified independence;sitting/lateral leans Pt Will Transfer to Toilet: with modified independence;stand pivot transfer Pt Will Perform Toileting - Clothing Manipulation and hygiene: with modified independence;sit to/from stand  OT Frequency: Min 2X/week   Barriers to D/C:    none known at this time          AM-PAC OT "6 Clicks" Daily Activity     Outcome Measure Help from another person eating meals?: None Help from another person taking care of personal grooming?: None Help from another person toileting, which includes using toliet, bedpan, or urinal?: A Little Help from another person bathing (including washing, rinsing, drying)?: A Little Help from another person to put on and taking off regular upper body clothing?: None Help from another person to put on and taking off regular lower body  clothing?: A Little 6 Click Score: 21   End of Session Nurse Communication: Mobility status  Activity Tolerance: Patient tolerated treatment well Patient left: in bed;with call bell/phone within reach  OT Visit Diagnosis: Unsteadiness on feet (R26.81);Repeated falls (R29.6);Muscle weakness (generalized) (M62.81)                Time: 8850-2774 OT Time Calculation (min): 16 min Charges:  OT General Charges $OT Visit: 1 Visit OT Evaluation $OT Eval Low Complexity: 1 Low OT Treatments $Self Care/Home Management : 8-22 mins  Jackquline Denmark, MS, OTR/L , CBIS ascom [phone]  05/14/21, 1:23 PM

## 2021-05-14 NOTE — Progress Notes (Signed)
PROGRESS NOTE    Tyrone Keith  ZCH:[id number] DOB: [birth year] DOA: 05/13/2021 PCP: Kandyce Rud, MD   Assessment & Plan:   Active Problems:   CAP (community acquired pneumonia)   CAP: continue on IV rocephin, azithromycin & bronchodilators. Encourage incentive spirometry. Strep is neg, legionella is pending still. Procal ordered   Hypothyroidism: w/ hx of noncompliance. Continue on synthroid   Sinus bradycardia: continue to hold home dose of coreg   HLD: continue on statin   Hx of CAD: continue on aspirin, statin,   PAF: continue to hold coreg. Not on chronic anticoagulation, likely secondary to high fall risk   Normocytic anemia: H&H labile. No need for a transfusion currently   Thrombocytopenia: etiology unclear. Will continue to monitor     DVT prophylaxis: lovenox  Code Status: full  Family Communication: Disposition Plan: (likely d/c back home  Level of care: Med-Surg  Status is: Inpatient  Remains inpatient appropriate because:IV treatments appropriate due to intensity of illness or inability to take PO and Inpatient level of care appropriate due to severity of illness  Dispo: The patient is from: Home              Anticipated d/c is to: Home              Patient currently is not medically stable to d/c.   Difficult to place patient : unclear    Consultants:    Procedures:   Antimicrobials:azithromycin, rocephin    Subjective: Pt c/o malaise   Objective: Vitals:   05/13/21 2217 05/14/21 0540 05/14/21 0731 05/14/21 1121  BP:  (!) 122/53 (!) 117/53 (!) 100/51  Pulse:  (!) 50 (!) 50 (!) 57  Resp:  16 17 17   Temp:  (!) 97.4 F (36.3 C) 98 F (36.7 C) 98.3 F (36.8 C)  TempSrc:  Oral Oral   SpO2:  100% 100% 100%  Weight: 72.9 kg     Height: 6\' 1"  (1.854 m)       Intake/Output Summary (Last 24 hours) at 05/14/2021 1304 Last data filed at 05/14/2021 0946 Gross per 24 hour  Intake 240 ml  Output 525 ml  Net -285 ml   Filed Weights    05/13/21 2217  Weight: 72.9 kg    Examination:  General exam: Appears calm and comfortable  Respiratory system: diminished breath sounds b/l. No rales  Cardiovascular system: S1 & S2 +. No rubs, gallops or clicks.  Gastrointestinal system: Abdomen is nondistended, soft and nontender. Hypoactive bowel sounds heard. Central nervous system: Alert and awake. Moves all extremities  Psychiatry: Judgement and insight appear normal. Flat mood and affect    Data Reviewed: I have personally reviewed following labs and imaging studies  CBC: Recent Labs  Lab 05/13/21 1715 05/14/21 0421  WBC 3.9* 2.9*  HGB 11.4* 10.5*  HCT 34.0* 31.5*  MCV 96.6 95.7  PLT 134* 118*   Basic Metabolic Panel: Recent Labs  Lab 05/13/21 1715 05/14/21 0421  NA 138 139  K 4.4 4.6  CL 107 108  CO2 27 28  GLUCOSE 101* 81  BUN 19 18  CREATININE 1.38* 1.16  CALCIUM 8.5* 8.1*   GFR: Estimated Creatinine Clearance: 47.1 mL/min (by C-G formula based on SCr of 1.16 mg/dL). Liver Function Tests: No results for input(s): AST, ALT, ALKPHOS, BILITOT, PROT, ALBUMIN in the last 168 hours. No results for input(s): LIPASE, AMYLASE in the last 168 hours. No results for input(s): AMMONIA in the last 168 hours. Coagulation Profile: No results  for input(s): INR, PROTIME in the last 168 hours. Cardiac Enzymes: No results for input(s): CKTOTAL, CKMB, CKMBINDEX, TROPONINI in the last 168 hours. BNP (last 3 results) No results for input(s): PROBNP in the last 8760 hours. HbA1C: No results for input(s): HGBA1C in the last 72 hours. CBG: No results for input(s): GLUCAP in the last 168 hours. Lipid Profile: No results for input(s): CHOL, HDL, LDLCALC, TRIG, CHOLHDL, LDLDIRECT in the last 72 hours. Thyroid Function Tests: Recent Labs    05/13/21 1700 05/13/21 1913  TSH 49.000*  --   FREET4  --  <0.25*   Anemia Panel: No results for input(s): VITAMINB12, FOLATE, FERRITIN, TIBC, IRON, RETICCTPCT in the last 72  hours. Sepsis Labs: No results for input(s): PROCALCITON, LATICACIDVEN in the last 168 hours.  Recent Results (from the past 240 hour(s))  Blood culture (routine x 2)     Status: None (Preliminary result)   Collection Time: 05/13/21  7:55 PM   Specimen: BLOOD  Result Value Ref Range Status   Specimen Description BLOOD RIGHT ANTECUBITAL  Final   Special Requests   Final    BOTTLES DRAWN AEROBIC AND ANAEROBIC Blood Culture adequate volume   Culture   Final    NO GROWTH < 12 HOURS Performed at [facility], [address] [place], [place] [address]    Report Status PENDING  Incomplete  Blood culture (routine x 2)     Status: None (Preliminary result)   Collection Time: 05/13/21  7:55 PM   Specimen: BLOOD  Result Value Ref Range Status   Specimen Description BLOOD BLOOD LEFT FOREARM  Final   Special Requests   Final    BOTTLES DRAWN AEROBIC AND ANAEROBIC Blood Culture results may not be optimal due to an inadequate volume of blood received in culture bottles   Culture   Final    NO GROWTH < 12 HOURS Performed at [facility], [address] [place], [place] [address]    Report Status PENDING  Incomplete  Resp Panel by RT-PCR (Flu A&B, Covid) Nasopharyngeal Swab     Status: None   Collection Time: 05/13/21  7:55 PM   Specimen: Nasopharyngeal Swab; Nasopharyngeal(NP) swabs in vial transport medium  Result Value Ref Range Status   SARS Coronavirus 2 by RT PCR NEGATIVE NEGATIVE Final    Comment: (NOTE) SARS-CoV-2 target nucleic acids are NOT DETECTED.  The SARS-CoV-2 RNA is generally detectable in upper respiratory specimens during the acute phase of infection. The lowest concentration of SARS-CoV-2 viral copies this assay can detect is 138 copies/mL. A negative result does not preclude SARS-Cov-2 infection and should not be used as the sole basis for treatment or other patient management decisions. A negative result may occur with  improper specimen  collection/handling, submission of specimen other than nasopharyngeal swab, presence of viral mutation(s) within the areas targeted by this assay, and inadequate number of viral copies(<138 copies/mL). A negative result must be combined with clinical observations, patient history, and epidemiological information. The expected result is Negative.  Fact Sheet for Patients:  BloggerCourse.com  Fact Sheet for Healthcare Providers:  SeriousBroker.it  This test is no t yet approved or cleared by the [place] FDA and  has been authorized for detection and/or diagnosis of SARS-CoV-2 by FDA under an Emergency Use Authorization (EUA). This EUA will remain  in effect (meaning this test can be used) for the duration of the COVID-19 declaration under Section 564(b)(1) of the Act, 21 U.S.C.section 360bbb-3(b)(1), unless the authorization  is terminated  or revoked sooner.       Influenza A by PCR NEGATIVE NEGATIVE Final   Influenza B by PCR NEGATIVE NEGATIVE Final    Comment: (NOTE) The Xpert Xpress SARS-CoV-2/FLU/RSV plus assay is intended as an aid in the diagnosis of influenza from Nasopharyngeal swab specimens and should not be used as a sole basis for treatment. Nasal washings and aspirates are unacceptable for Xpert Xpress SARS-CoV-2/FLU/RSV testing.  Fact Sheet for Patients: BloggerCourse.com  Fact Sheet for Healthcare Providers: SeriousBroker.it  This test is not yet approved or cleared by the [place] FDA and has been authorized for detection and/or diagnosis of SARS-CoV-2 by FDA under an Emergency Use Authorization (EUA). This EUA will remain in effect (meaning this test can be used) for the duration of the COVID-19 declaration under Section 564(b)(1) of the Act, 21 U.S.C. section 360bbb-3(b)(1), unless the authorization is terminated or revoked.  Performed at [facility], [address], [place], [place] [address]          Radiology Studies: CT Head Wo Contrast  Result Date: 05/13/2021 CLINICAL DATA:  Weakness.  Altered mental status. EXAM: CT HEAD WITHOUT CONTRAST TECHNIQUE: Contiguous axial images were obtained from the base of the skull through the vertex without intravenous contrast. COMPARISON:  May 04, 2020 FINDINGS: Brain: No evidence of acute infarction, hemorrhage, hydrocephalus, extra-axial collection or mass lesion/mass effect. Moderate deep white matter microangiopathy. Vascular: No hyperdense vessel or unexpected calcification. Skull: Normal. Negative for fracture or focal lesion. Sinuses/Orbits: Post broad mucosal thickening of the ethmoid sinuses, right maxillary and left sphenoid sinuses. Other: None. IMPRESSION: 1. No acute intracranial abnormality. 2. Chronic sinusitis. Electronically Signed   By: Ted Mcalpine M.D.   On: 05/13/2021 18:50   DG Chest Portable 1 View  Result Date: 05/13/2021 CLINICAL DATA:  , Generalized weakness EXAM: PORTABLE CHEST 1 VIEW COMPARISON:  05/04/2020 FINDINGS: Bilateral lower lobe airspace opacities are noted. Small bilateral effusions. Heart is normal size. Aortic atherosclerosis. No acute bony abnormality. IMPRESSION: Bilateral lower lobe airspace opacities concerning for pneumonia. Small bilateral effusions. Electronically Signed   By: Charlett Nose M.D.   On: 05/13/2021 18:31        Scheduled Meds:  aspirin EC  81 mg Oral Daily   enoxaparin (LOVENOX) injection  40 mg Subcutaneous Q24H   feeding supplement  237 mL Oral BID BM   guaiFENesin  600 mg Oral BID   isosorbide mononitrate  15 mg Oral Daily   levothyroxine  112 mcg Oral Q0600   Continuous Infusions:  sodium chloride 75 mL/hr at 05/14/21 0944   azithromycin     cefTRIAXone (ROCEPHIN)  IV       LOS: 1 day    Time spent: 31 mins     Charise Killian, MD [facility] Pager [phone]  If 7PM-7AM,  please contact night-coverage 05/14/2021, 1:04 PM

## 2021-05-14 NOTE — Progress Notes (Signed)
Initial Nutrition Assessment  DOCUMENTATION CODES:  Severe malnutrition in context of chronic illness  INTERVENTION:  Modify diet to DYS 3 to assist with chewing difficulties Ensure Enlive po TID, each supplement provides 350 kcal and 20 grams of protein MVI with minerals daily. Magic cup TID with meals, each supplement provides 290 kcal and 9 grams of protein  NUTRITION DIAGNOSIS:  Severe Malnutrition (in the context of chronic illness) related to  (inadequate energy intake) as evidenced by severe fat depletion, severe muscle depletion.  GOAL:  Patient will meet greater than or equal to 90% of their needs  MONITOR:  PO intake, Supplement acceptance  REASON FOR ASSESSMENT:  Malnutrition Screening Tool    ASSESSMENT:  87 y.o. male with hx of CAD, graves disease, HTN, HLD, and A-fib presented to ED from home with for increased weakness over the last few weeks to the point pt is having difficulty walking. Also reports weight loss and feeling cold frequently.  Pt found to have severe hypothyroidism on admission - has not been taking his synthroid.  Pt resting in bedside chair at the time of assessment. Awake and alert, but seems confused. Pt answered some questions, but mostly repeated questions back after they were asked. Poor dentition noted. Adjusted diet order for easier chewing.   Pt reports he is eating well now - unsure about intake PTA but wife had previously reported a poor intake for quite sometime. Pt does report that he drinks ensure at home. Will add to regimen here to encourage intake and weight stability.  Average Meal Intake: 10/10: 100% intake x 1 recorded meal  Nutritionally Relevant Medications: Continuous Infusions:  sodium chloride 75 mL/hr at 05/14/21 0944   azithromycin     cefTRIAXone (ROCEPHIN)  IV     PRN Meds: magnesium hydroxide, ondansetron  Labs Reviewed: T4 <.25  NUTRITION - FOCUSED PHYSICAL EXAM: Flowsheet Row Most Recent Value  Orbital  Region Mild depletion  Upper Arm Region Severe depletion  Thoracic and Lumbar Region Severe depletion  Buccal Region Mild depletion  Temple Region Moderate depletion  Clavicle Bone Region Moderate depletion  Clavicle and Acromion Bone Region Severe depletion  Scapular Bone Region Severe depletion  Dorsal Hand Mild depletion  Patellar Region Moderate depletion  Anterior Thigh Region Moderate depletion  Posterior Calf Region Mild depletion  Edema (RD Assessment) Mild  Hair Reviewed  Eyes Reviewed  Mouth Reviewed  Skin Reviewed  [dry flakey]  Nails Reviewed   Diet Order:   Diet Order             DIET DYS 3 Room service appropriate? Yes with Assist; Fluid consistency: Thin  Diet effective now                  EDUCATION NEEDS:  No education needs have been identified at this time  Skin:  Skin Assessment: Reviewed RN Assessment  Last BM:  10/8  Height:  Ht Readings from Last 1 Encounters:  05/13/21 6\' 1"  (1.854 m)   Weight:  Wt Readings from Last 1 Encounters:  05/13/21 72.9 kg   Ideal Body Weight:  83.6 kg  BMI:  Body mass index is 21.2 kg/m.  Estimated Nutritional Needs:  Kcal:  1900-2100 kcal/d Protein:  95-105 g/d Fluid:  2.1-2.2 L/d   07/13/21, RD, LDN Clinical Dietitian RD pager # available in [facility]  After hours/weekend pager # available in [facility]

## 2021-05-14 NOTE — TOC Initial Note (Signed)
Transition of Care [facility]) - Initial/Assessment Note    Patient Details  Name: Tyrone Keith MRN: [id number] Date of Birth: 06/25/1933  Transition of Care [facility]) CM/SW Contact:    Shelbie Hutching, RN Phone Number: 05/14/2021, 1:47 PM  Clinical Narrative:                 Patient admitted to the hospital with community acquired pneumonia.  RNCM met with patient at the bedside.  Patient is from home with his wife and adult daughter and grandchildren.  Patient reports being independent with no DME.  Patient's wife provides transportation, PCP is Dr. Loney Hering.  PT is recommending outpatient physical therapy - patient did not seem sure about Outpatient he said it would depend on his wife being able to take him back and forth.  RNCM will reach out to patient's wife to discuss dc planning.   PT recommending a walker.  RNCM will order one from [employer].    Expected Discharge Plan: OP Rehab Barriers to Discharge: Continued Medical Work up   Patient Goals and CMS Choice Patient states their goals for this hospitalization and ongoing recovery are:: Patient wants to go home and feel better      Expected Discharge Plan and Services Expected Discharge Plan: OP Rehab   Discharge Planning Services: CM Consult   Living arrangements for the past 2 months: Single Family Home                           HH Arranged: NA [employer] Agency: NA        Prior Living Arrangements/Services Living arrangements for the past 2 months: Single Family Home Lives with:: Spouse, Adult Children Patient language and need for interpreter reviewed:: Yes Do you feel safe going back to the place where you live?: Yes      Need for Family Participation in Patient Care: Yes (Comment) Care giver support system in place?: Yes (comment)   Criminal Activity/Legal Involvement Pertinent to Current Situation/Hospitalization: No - Comment as needed  Activities of Daily Living Home Assistive Devices/Equipment: Eyeglasses ADL Screening  (condition at time of admission) Patient's cognitive ability adequate to safely complete daily activities?: Yes Is the patient deaf or have difficulty hearing?: Yes Does the patient have difficulty seeing, even when wearing glasses/contacts?: No Does the patient have difficulty concentrating, remembering, or making decisions?: Yes Patient able to express need for assistance with ADLs?: Yes Does the patient have difficulty dressing or bathing?: Yes Independently performs ADLs?: Yes (appropriate for developmental age) Does the patient have difficulty walking or climbing stairs?: Yes Weakness of Legs: Both Weakness of Arms/Hands: None  Permission Sought/Granted Permission sought to share information with : Case Manager, Family Supports, Other (comment) Permission granted to share information with : Yes, Verbal Permission Granted  Share Information with NAME: Feliciano Wynter  Permission granted to share info w AGENCY: OP PT [facility]  Permission granted to share info w Relationship: wife  Permission granted to share info w Contact Information: [phone]  Emotional Assessment Appearance:: Appears stated age Attitude/Demeanor/Rapport: Engaged Affect (typically observed): Accepting Orientation: : Oriented to Self, Oriented to Place, Oriented to Situation, Oriented to  Time Alcohol / Substance Use: Not Applicable Psych Involvement: No (comment)  Admission diagnosis:  Weakness [R53.1] CAP (community acquired pneumonia) [J18.9] Community acquired pneumonia, unspecified laterality [J18.9] Patient Active Problem List   Diagnosis Date Noted   CAP (community acquired pneumonia) 05/13/2021   Pericardial calcification 05/19/2019   Chest pain 05/19/2019  Shortness of breath 05/19/2019   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   BPH (benign prostatic hyperplasia) 02/09/2015   Graves disease    Abnormal weight loss 11/05/2014   Acute kidney failure ([place]) 11/05/2014   A-fib ([place])  11/05/2014   Benign fibroma of prostate 11/05/2014   Arteriosclerosis of coronary artery 11/05/2014   Abnormal prostate specific antigen 11/05/2014   Epididymitis 11/05/2014   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   Essential hypertension 11/05/2014   Excessive urination at night 11/05/2014   Hypothyroidism, postop 11/05/2014   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 [place] [address] Phone: [phone] Fax: [facility] #[id number] - [address], [address] [place] [address] Phone: [phone] Fax: [phone]     Social Determinants of Health (SDOH) Interventions    Readmission Risk Interventions No flowsheet data found.

## 2021-05-14 NOTE — Evaluation (Signed)
Physical Therapy Evaluation Patient Details Name: Tyrone Keith MRN: [id number] DOB: 07-07-1933 Today's Date: 05/14/2021  History of Present Illness  Pt is an 87 y.o. male presenting to hospital with concerns of increased weakness and difficulty walking; also significant weight loss and c/o being cold all the time; also has had a cough; some slurred speech noted. (-) CT head for acute intracranial abnormality.  Pt admitted with bibasal community acquired PNA (likely bacterial), severe hypothyroidism (likely secondary to noncompliance), and sinus bradycardia.  PMH includes acute kidney failure, a-fib, CAD, Graves disease, htn, nocturia, chest pain, h/o eye surgery, s/p thyroidectomy.  Clinical Impression  Prior to hospital admission, pt was independent with ambulation; lives with his wife in 1 level home with steps to enter.  Currently pt is modified independent semi-supine to sitting edge of bed; CGA with transfers; and CGA to ambulate 200 feet (pt appearing more steady ambulating with RW compared to with no UE support).  Pt would benefit from skilled PT to address noted impairments and functional limitations (see below for any additional details).  Upon hospital discharge, pt would benefit from OP PT.       Recommendations for follow up therapy are one component of a multi-disciplinary discharge planning process, led by the attending physician.  Recommendations may be updated based on patient status, additional functional criteria and insurance authorization.  Follow Up Recommendations Outpatient PT    Equipment Recommendations  Rolling walker with 5" wheels;3in1 (PT)    Recommendations for Other Services       Precautions / Restrictions Precautions Precautions: Fall Restrictions Weight Bearing Restrictions: No      Mobility  Bed Mobility Overal bed mobility: Modified Independent             General bed mobility comments: Semi-supine to sitting edge of bed with mild increased  effort; HOB elevated    Transfers Overall transfer level: Needs assistance Equipment used: Rolling walker (2 wheeled) Transfers: Sit to/from Stand Sit to Stand: Min guard         General transfer comment: mild increased effort to stand up to RW  Ambulation/Gait Ambulation/Gait assistance: Min guard Gait Distance (Feet): 200 Feet Assistive device: None;Rolling walker (2 wheeled)   Gait velocity: decreased   General Gait Details: pt mildly unsteady ambulating 40 feet with no AD (requiring CGA for safety) and pt steady ambulating 160 feet with RW  Stairs            Wheelchair Mobility    Modified Rankin (Stroke Patients Only)       Balance Overall balance assessment: Needs assistance Sitting-balance support: No upper extremity supported;Feet supported Sitting balance-Leahy Scale: Good Sitting balance - Comments: steady sitting reaching within BOS   Standing balance support: No upper extremity supported;During functional activity Standing balance-Leahy Scale: Fair Standing balance comment: mildly unsteady ambulating without UE support                             Pertinent Vitals/Pain Pain Assessment: No/denies pain Vitals (HR and O2 on room air) stable and WFL throughout treatment session.    Home Living Family/patient expects to be discharged to:: Private residence Living Arrangements: Spouse/significant other Available Help at Discharge: Family Type of Home: House Home Access: Stairs to enter Entrance Stairs-Rails: Doctor, general practice of Steps: 5 Home Layout: One level Home Equipment: None      Prior Function Level of Independence: Independent         Comments:  Pt reports no recent falls     Hand Dominance        Extremity/Trunk Assessment   Upper Extremity Assessment Upper Extremity Assessment: Generalized weakness    Lower Extremity Assessment Lower Extremity Assessment: Generalized weakness    Cervical /  Trunk Assessment Cervical / Trunk Assessment: Normal  Communication   Communication: HOH  Cognition Arousal/Alertness: Awake/alert Behavior During Therapy: WFL for tasks assessed/performed Overall Cognitive Status: No family/caregiver present to determine baseline cognitive functioning                                 General Comments: Oriented to person, place, general situation, and year (pt reported it was Nov/Dec)      General Comments  Nursing cleared pt for participation in physical therapy.  Pt agreeable to PT session.    Exercises     Assessment/Plan    PT Assessment Patient needs continued PT services  PT Problem List Decreased strength;Decreased activity tolerance;Decreased balance;Decreased mobility;Decreased knowledge of use of DME       PT Treatment Interventions DME instruction;Gait training;Stair training;Functional mobility training;Therapeutic activities;Therapeutic exercise;Balance training;Patient/family education    PT Goals (Current goals can be found in the Care Plan section)  Acute Rehab PT Goals Patient Stated Goal: to go home PT Goal Formulation: With patient Time For Goal Achievement: 05/28/21 Potential to Achieve Goals: Good    Frequency Min 2X/week   Barriers to discharge        Co-evaluation               AM-PAC PT "6 Clicks" Mobility  Outcome Measure Help needed turning from your back to your side while in a flat bed without using bedrails?: None Help needed moving from lying on your back to sitting on the side of a flat bed without using bedrails?: None Help needed moving to and from a bed to a chair (including a wheelchair)?: A Little Help needed standing up from a chair using your arms (e.g., wheelchair or bedside chair)?: A Little Help needed to walk in hospital room?: A Little Help needed climbing 3-5 steps with a railing? : A Little 6 Click Score: 20    End of Session Equipment Utilized During Treatment: Gait  belt Activity Tolerance: Patient tolerated treatment well Patient left: in chair;with call bell/phone within reach;with chair alarm set Nurse Communication: Mobility status;Precautions PT Visit Diagnosis: Muscle weakness (generalized) (M62.81);Other abnormalities of gait and mobility (R26.89);Unsteadiness on feet (R26.81)    Time: 0938-1829 PT Time Calculation (min) (ACUTE ONLY): 29 min   Charges:   PT Evaluation $PT Eval Low Complexity: 1 Low PT Treatments $Therapeutic Activity: 8-22 mins       Hendricks Limes, PT 05/14/21, 10:04 AM

## 2021-05-15 DIAGNOSIS — E43 Unspecified severe protein-calorie malnutrition: Secondary | ICD-10-CM | POA: Diagnosis not present

## 2021-05-15 DIAGNOSIS — E039 Hypothyroidism, unspecified: Secondary | ICD-10-CM | POA: Diagnosis not present

## 2021-05-15 DIAGNOSIS — J189 Pneumonia, unspecified organism: Secondary | ICD-10-CM | POA: Diagnosis not present

## 2021-05-15 LAB — EXPECTORATED SPUTUM ASSESSMENT W GRAM STAIN, RFLX TO RESP C

## 2021-05-15 LAB — BASIC METABOLIC PANEL
Anion gap: 2 — ABNORMAL LOW (ref 5–15)
BUN: 17 mg/dL (ref 8–23)
CO2: 28 mmol/L (ref 22–32)
Calcium: 7.8 mg/dL — ABNORMAL LOW (ref 8.9–10.3)
Chloride: 109 mmol/L (ref 98–111)
Creatinine, Ser: 1.22 mg/dL (ref 0.61–1.24)
GFR, Estimated: 58 mL/min — ABNORMAL LOW (ref >60)
Glucose, Bld: 73 mg/dL (ref 70–99)
Potassium: 4.7 mmol/L (ref 3.5–5.1)
Sodium: 139 mmol/L (ref 135–145)

## 2021-05-15 LAB — CBC
HCT: 28.3 % — ABNORMAL LOW (ref 39.0–52.0)
Hemoglobin: 9.3 g/dL — ABNORMAL LOW (ref 13.0–17.0)
MCH: 31.3 pg (ref 26.0–34.0)
MCHC: 32.9 g/dL (ref 30.0–36.0)
MCV: 95.3 fL (ref 80.0–100.0)
Platelets: 106 10*3/uL — ABNORMAL LOW (ref 150–400)
RBC: 2.97 MIL/uL — ABNORMAL LOW (ref 4.22–5.81)
RDW: 16.2 % — ABNORMAL HIGH (ref 11.5–15.5)
WBC: 2.6 10*3/uL — ABNORMAL LOW (ref 4.0–10.5)
nRBC: 0 % (ref 0.0–0.2)

## 2021-05-15 LAB — LEGIONELLA PNEUMOPHILA SEROGP 1 UR AG: L. pneumophila Serogp 1 Ur Ag: NEGATIVE

## 2021-05-15 MED ORDER — AZITHROMYCIN 500 MG PO TABS
500.0000 mg | ORAL_TABLET | Freq: Every day | ORAL | Status: DC
  Administered 2021-05-15: 17:00:00 500 mg via ORAL
  Filled 2021-05-15: qty 1

## 2021-05-15 MED ORDER — ADULT MULTIVITAMIN W/MINERALS CH
1.0000 | ORAL_TABLET | Freq: Every day | ORAL | Status: DC
  Administered 2021-05-15 – 2021-05-16 (×2): 1 via ORAL
  Filled 2021-05-15 (×3): qty 1

## 2021-05-15 MED ORDER — CEFUROXIME AXETIL 500 MG PO TABS
500.0000 mg | ORAL_TABLET | Freq: Two times a day (BID) | ORAL | Status: DC
  Administered 2021-05-15 – 2021-05-16 (×2): 500 mg via ORAL
  Filled 2021-05-15 (×3): qty 1

## 2021-05-15 MED ORDER — ENSURE ENLIVE PO LIQD
237.0000 mL | Freq: Three times a day (TID) | ORAL | Status: DC
  Administered 2021-05-15 – 2021-05-16 (×4): 237 mL via ORAL

## 2021-05-15 MED ORDER — SODIUM CHLORIDE 0.9 % IV BOLUS
250.0000 mL | Freq: Once | INTRAVENOUS | Status: AC
  Administered 2021-05-15: 04:00:00 250 mL via INTRAVENOUS

## 2021-05-15 NOTE — Progress Notes (Signed)
Occupational Therapy Treatment Patient Details Name: Tyrone Keith MRN: [id number] DOB: 06-Feb-1933 Today's Date: 05/15/2021   History of present illness Pt is an 87 y.o. male presenting to hospital with concerns of increased weakness and difficulty walking; also significant weight loss and c/o being cold all the time; also has had a cough; some slurred speech noted. (-) CT head for acute intracranial abnormality.  Pt admitted with bibasal community acquired PNA (likely bacterial), severe hypothyroidism (likely secondary to noncompliance), and sinus bradycardia.  PMH includes acute kidney failure, a-fib, CAD, Graves disease, htn, nocturia, chest pain, h/o eye surgery, s/p thyroidectomy.   OT comments  Upon entering the room, pt supine in bed with wife present in the room. Pt is agreeable to OT intervention and wife reports pt just finished returning to bed from bathroom and has already performs ADL tasks this morning. Pt performing bed mobility without assistance. Pt stands and ambulates while pushing IV pole this session with min guard progressing to close supervision. Pt needing min verbal guidance cuing for navigation to locate room in hallway. Pt with no LOB this session. Pt seated on EOB and OT educated pt on use of incentive spiromenter which he was able to demonstrate 10 reps before returning to bed. All needs within reach and pt requesting to rest.    Recommendations for follow up therapy are one component of a multi-disciplinary discharge planning process, led by the attending physician.  Recommendations may be updated based on patient status, additional functional criteria and insurance authorization.    Follow Up Recommendations  No OT follow up;Supervision - Intermittent    Equipment Recommendations  None recommended by OT       Precautions / Restrictions Precautions Precautions: Fall Restrictions Weight Bearing Restrictions: No       Mobility Bed Mobility Overal bed mobility:  Needs Assistance Bed Mobility: Supine to Sit;Sit to Supine     Supine to sit: Supervision Sit to supine: Supervision   General bed mobility comments: no physical assistance needed but cuing for technique    Transfers Overall transfer level: Needs assistance Equipment used: None Transfers: Sit to/from Stand;Stand Pivot Transfers Sit to Stand: Min guard;Supervision Stand pivot transfers: Min guard;Supervision       General transfer comment: min guard progressing to supervision and pt holding ont IV pole with one hand.    Balance Overall balance assessment: Needs assistance Sitting-balance support: No upper extremity supported;Feet supported Sitting balance-Leahy Scale: Good Sitting balance - Comments: steady sitting reaching within BOS   Standing balance support: No upper extremity supported;During functional activity Standing balance-Leahy Scale: Fair Standing balance comment: mildly unsteady ambulating without UE support                           ADL either performed or assessed with clinical judgement     Vision Patient Visual Report: No change from baseline            Cognition Arousal/Alertness: Awake/alert Behavior During Therapy: WFL for tasks assessed/performed Overall Cognitive Status: No family/caregiver present to determine baseline cognitive functioning                                 General Comments: Pt is alert and oriented x 4 but requires increased time to process information. He does follow commands.  Pertinent Vitals/ Pain       Pain Assessment: No/denies pain         Frequency  Min 2X/week        Progress Toward Goals  OT Goals(current goals can now be found in the care plan section)  Progress towards OT goals: Progressing toward goals  Acute Rehab OT Goals Patient Stated Goal: to go home OT Goal Formulation: With patient Time For Goal Achievement: 05/28/21 Potential to Achieve  Goals: Good  Plan Discharge plan remains appropriate;Frequency remains appropriate       AM-PAC OT "6 Clicks" Daily Activity     Outcome Measure   Help from another person eating meals?: None Help from another person taking care of personal grooming?: None Help from another person toileting, which includes using toliet, bedpan, or urinal?: A Little Help from another person bathing (including washing, rinsing, drying)?: A Little Help from another person to put on and taking off regular upper body clothing?: None Help from another person to put on and taking off regular lower body clothing?: A Little 6 Click Score: 21    End of Session    OT Visit Diagnosis: Unsteadiness on feet (R26.81);Repeated falls (R29.6);Muscle weakness (generalized) (M62.81)   Activity Tolerance Patient tolerated treatment well   Patient Left in bed;with call bell/phone within reach;with bed alarm set   Nurse Communication Mobility status        Time: 8280-0349 OT Time Calculation (min): 25 min  Charges: OT General Charges $OT Visit: 1 Visit OT Treatments $Therapeutic Activity: 23-37 mins  Jackquline Denmark, MS, OTR/L , CBIS ascom [phone]  05/15/21, 1:58 PM

## 2021-05-15 NOTE — Progress Notes (Signed)
PHARMACIST - PHYSICIAN COMMUNICATION  CONCERNING: Antibiotic IV to Oral Route Change Policy  RECOMMENDATION: This patient is receiving azithromycin by the intravenous route.  Based on criteria approved by the Pharmacy and Therapeutics Committee, the antibiotic(s) is/are being converted to the equivalent oral dose form(s).   DESCRIPTION: These criteria include: Patient being treated for a respiratory tract infection, urinary tract infection, cellulitis or clostridium difficile associated diarrhea if on metronidazole The patient is not neutropenic and does not exhibit a GI malabsorption state The patient is eating (either orally or via tube) and/or has been taking other orally administered medications for a least 24 hours The patient is improving clinically and has a Tmax < 100.5  If you have questions about this conversion, please contact the Pharmacy Department   Tressie Ellis  05/15/21

## 2021-05-15 NOTE — Progress Notes (Addendum)
PROGRESS NOTE    Tyrone Keith  ONG:[id number] DOB: [birth year] DOA: 05/13/2021 PCP: Kandyce Rud, MD   Assessment & Plan:   Active Problems:   CAP (community acquired pneumonia)   Protein-calorie malnutrition, severe   CAP: continue on IV azithromycin, rocephin & bronchodilators. Encourage incentive spirometry. Legionella is pending still. Strep neg. Procal <0.10  Hypothyroidism: severe w/ hx of noncompliance. TSH 49. Continue on synthroid. Will need close outpatient f/u   Pancytopenia: etiology unclear. Will need outpatient f/u w/ CBC and may need to see hematology if all 3 cell lines don't recover   Sinus bradycardia: continue to hold home dose of BB   HLD: continue on statin   Hx of CAD:  continue on aspirin, statin   PAF: continue to hold coreg. Not on chronic anticoagulation, likely secondary to high fall risk   Severe protein-calorie malnutrition: w/ poor appetite. Started on ensure and multivitamin      DVT prophylaxis: lovenox  Code Status: full  Family Communication: discussed pt's care w/ pt's wife at bedside and answered her questions  Disposition Plan: likely d/c back home  Level of care: Med-Surg  Status is: Inpatient  Remains inpatient appropriate because:IV treatments appropriate due to intensity of illness or inability to take PO and Inpatient level of care appropriate due to severity of illness, can likely d/c home tomorrow if pt remains stable   Dispo: The patient is from: Home              Anticipated d/c is to: Home              Patient currently is not medically stable to d/c.   Difficult to place patient : unclear    Consultants:    Procedures:   Antimicrobials:azithromycin, rocephin    Subjective: Pt c/o fatigue    Objective: Vitals:   05/14/21 1525 05/14/21 2010 05/15/21 0017 05/15/21 0329  BP: (!) 99/44 (!) 100/41 (!) 92/47 (!) 96/49  Pulse: (!) 51 (!) 53 (!) 52 (!) 51  Resp: 18 14 14 14   Temp: 98.3 F (36.8 C) 97.7 F  (36.5 C) 98.4 F (36.9 C) 98.4 F (36.9 C)  TempSrc:  Oral Oral Oral  SpO2: 100% 97% 100% 99%  Weight:      Height:        Intake/Output Summary (Last 24 hours) at 05/15/2021 0726 Last data filed at 05/15/2021 07/15/2021 Gross per 24 hour  Intake 2244.78 ml  Output 600 ml  Net 1644.78 ml   Filed Weights   05/13/21 2217  Weight: 72.9 kg    Examination:  General exam: Appears comfortable  Respiratory system: decreased breath sounds b/l Cardiovascular system: S1/S2+. No rubs or clicks  Gastrointestinal system: Abd is soft, NT, ND & hypoactive bowel sounds  Central nervous system: Alert and awake. Moves all extremities  Psychiatry: Judgement and insight appear normal. Flat mood and affect     Data Reviewed: I have personally reviewed following labs and imaging studies  CBC: Recent Labs  Lab 05/13/21 1715 05/14/21 0421 05/15/21 0458  WBC 3.9* 2.9* 2.6*  HGB 11.4* 10.5* 9.3*  HCT 34.0* 31.5* 28.3*  MCV 96.6 95.7 95.3  PLT 134* 118* 106*   Basic Metabolic Panel: Recent Labs  Lab 05/13/21 1715 05/14/21 0421 05/15/21 0458  NA 138 139 139  K 4.4 4.6 4.7  CL 107 108 109  CO2 27 28 28   GLUCOSE 101* 81 73  BUN 19 18 17   CREATININE 1.38* 1.16 1.22  CALCIUM 8.5*  8.1* 7.8*   GFR: Estimated Creatinine Clearance: 44.8 mL/min (by C-G formula based on SCr of 1.22 mg/dL). Liver Function Tests: No results for input(s): AST, ALT, ALKPHOS, BILITOT, PROT, ALBUMIN in the last 168 hours. No results for input(s): LIPASE, AMYLASE in the last 168 hours. No results for input(s): AMMONIA in the last 168 hours. Coagulation Profile: No results for input(s): INR, PROTIME in the last 168 hours. Cardiac Enzymes: No results for input(s): CKTOTAL, CKMB, CKMBINDEX, TROPONINI in the last 168 hours. BNP (last 3 results) No results for input(s): PROBNP in the last 8760 hours. HbA1C: No results for input(s): HGBA1C in the last 72 hours. CBG: No results for input(s): GLUCAP in the last 168  hours. Lipid Profile: No results for input(s): CHOL, HDL, LDLCALC, TRIG, CHOLHDL, LDLDIRECT in the last 72 hours. Thyroid Function Tests: Recent Labs    05/13/21 1700 05/13/21 1913  TSH 49.000*  --   FREET4  --  <0.25*   Anemia Panel: No results for input(s): VITAMINB12, FOLATE, FERRITIN, TIBC, IRON, RETICCTPCT in the last 72 hours. Sepsis Labs: Recent Labs  Lab 05/14/21 0421  PROCALCITON <0.10    Recent Results (from the past 240 hour(s))  Blood culture (routine x 2)     Status: None (Preliminary result)   Collection Time: 05/13/21  7:55 PM   Specimen: BLOOD  Result Value Ref Range Status   Specimen Description BLOOD RIGHT ANTECUBITAL  Final   Special Requests   Final    BOTTLES DRAWN AEROBIC AND ANAEROBIC Blood Culture adequate volume   Culture   Final    NO GROWTH 2 DAYS Performed at [facility], [address], [place], [place] [address]    Report Status PENDING  Incomplete  Blood culture (routine x 2)     Status: None (Preliminary result)   Collection Time: 05/13/21  7:55 PM   Specimen: BLOOD  Result Value Ref Range Status   Specimen Description BLOOD BLOOD LEFT FOREARM  Final   Special Requests   Final    BOTTLES DRAWN AEROBIC AND ANAEROBIC Blood Culture results may not be optimal due to an inadequate volume of blood received in culture bottles   Culture   Final    NO GROWTH 2 DAYS Performed at [facility], [address] [place], [place] [address]    Report Status PENDING  Incomplete  Resp Panel by RT-PCR (Flu A&B, Covid) Nasopharyngeal Swab     Status: None   Collection Time: 05/13/21  7:55 PM   Specimen: Nasopharyngeal Swab; Nasopharyngeal(NP) swabs in vial transport medium  Result Value Ref Range Status   SARS Coronavirus 2 by RT PCR NEGATIVE NEGATIVE Final    Comment: (NOTE) SARS-CoV-2 target nucleic acids are NOT DETECTED.  The SARS-CoV-2 RNA is generally detectable in upper respiratory specimens during the acute phase of  infection. The lowest concentration of SARS-CoV-2 viral copies this assay can detect is 138 copies/mL. A negative result does not preclude SARS-Cov-2 infection and should not be used as the sole basis for treatment or other patient management decisions. A negative result may occur with  improper specimen collection/handling, submission of specimen other than nasopharyngeal swab, presence of viral mutation(s) within the areas targeted by this assay, and inadequate number of viral copies(<138 copies/mL). A negative result must be combined with clinical observations, patient history, and epidemiological information. The expected result is Negative.  Fact Sheet for Patients:  BloggerCourse.com  Fact Sheet for Healthcare Providers:  SeriousBroker.it  This test is no t  yet approved or cleared by the [place] and  has been authorized for detection and/or diagnosis of SARS-CoV-2 by FDA under an Emergency Use Authorization (EUA). This EUA will remain  in effect (meaning this test can be used) for the duration of the COVID-19 declaration under Section 564(b)(1) of the Act, 21 U.S.C.section 360bbb-3(b)(1), unless the authorization is terminated  or revoked sooner.       Influenza A by PCR NEGATIVE NEGATIVE Final   Influenza B by PCR NEGATIVE NEGATIVE Final    Comment: (NOTE) The Xpert Xpress SARS-CoV-2/FLU/RSV plus assay is intended as an aid in the diagnosis of influenza from Nasopharyngeal swab specimens and should not be used as a sole basis for treatment. Nasal washings and aspirates are unacceptable for Xpert Xpress SARS-CoV-2/FLU/RSV testing.  Fact Sheet for Patients: BloggerCourse.com  Fact Sheet for Healthcare Providers: SeriousBroker.it  This test is not yet approved or cleared by the [place] FDA and has been authorized for detection and/or diagnosis of SARS-CoV-2  by FDA under an Emergency Use Authorization (EUA). This EUA will remain in effect (meaning this test can be used) for the duration of the COVID-19 declaration under Section 564(b)(1) of the Act, 21 U.S.C. section 360bbb-3(b)(1), unless the authorization is terminated or revoked.  Performed at [facility], [address] [place], [place] [address]   Expectorated Sputum Assessment w Gram Stain, Rflx to Resp Cult     Status: None   Collection Time: 05/15/21  1:32 AM   Specimen: Sputum  Result Value Ref Range Status   Specimen Description SPUTUM  Final   Special Requests NONE  Final   Sputum evaluation   Final    Sputum specimen not acceptable for testing.  Please recollect.   C/SASHONIE TAYLOR@[id number] /11/22 RH Performed at [facility], [address] [place], [place] [address]    Report Status 05/15/2021 FINAL  Final         Radiology Studies: CT Head Wo Contrast  Result Date: 05/13/2021 CLINICAL DATA:  Weakness.  Altered mental status. EXAM: CT HEAD WITHOUT CONTRAST TECHNIQUE: Contiguous axial images were obtained from the base of the skull through the vertex without intravenous contrast. COMPARISON:  May 04, 2020 FINDINGS: Brain: No evidence of acute infarction, hemorrhage, hydrocephalus, extra-axial collection or mass lesion/mass effect. Moderate deep white matter microangiopathy. Vascular: No hyperdense vessel or unexpected calcification. Skull: Normal. Negative for fracture or focal lesion. Sinuses/Orbits: Post broad mucosal thickening of the ethmoid sinuses, right maxillary and left sphenoid sinuses. Other: None. IMPRESSION: 1. No acute intracranial abnormality. 2. Chronic sinusitis. Electronically Signed   By: Ted Mcalpine M.D.   On: 05/13/2021 18:50   DG Chest Portable 1 View  Result Date: 05/13/2021 CLINICAL DATA:  , Generalized weakness EXAM: PORTABLE CHEST 1 VIEW COMPARISON:  05/04/2020 FINDINGS: Bilateral lower lobe airspace opacities  are noted. Small bilateral effusions. Heart is normal size. Aortic atherosclerosis. No acute bony abnormality. IMPRESSION: Bilateral lower lobe airspace opacities concerning for pneumonia. Small bilateral effusions. Electronically Signed   By: Charlett Nose M.D.   On: 05/13/2021 18:31        Scheduled Meds:  aspirin EC  81 mg Oral Daily   enoxaparin (LOVENOX) injection  40 mg Subcutaneous Q24H   feeding supplement  237 mL Oral BID BM   guaiFENesin  600 mg Oral BID   isosorbide mononitrate  15 mg Oral Daily   levothyroxine  112 mcg Oral Q0600   Continuous Infusions:  sodium chloride 75 mL/hr at 05/15/21  0045   azithromycin 500 mg (05/14/21 2007)   cefTRIAXone (ROCEPHIN)  IV 2 g (05/14/21 2135)     LOS: 2 days    Time spent: 34 mins     Charise Killian, MD [facility] Pager [phone]  If 7PM-7AM, please contact night-coverage 05/15/2021, 7:26 AM

## 2021-05-16 DIAGNOSIS — D61818 Other pancytopenia: Secondary | ICD-10-CM

## 2021-05-16 DIAGNOSIS — R001 Bradycardia, unspecified: Secondary | ICD-10-CM | POA: Diagnosis not present

## 2021-05-16 DIAGNOSIS — Z23 Encounter for immunization: Secondary | ICD-10-CM | POA: Diagnosis present

## 2021-05-16 DIAGNOSIS — E039 Hypothyroidism, unspecified: Secondary | ICD-10-CM

## 2021-05-16 DIAGNOSIS — J189 Pneumonia, unspecified organism: Secondary | ICD-10-CM | POA: Diagnosis not present

## 2021-05-16 LAB — BASIC METABOLIC PANEL
Anion gap: 6 (ref 5–15)
BUN: 20 mg/dL (ref 8–23)
CO2: 26 mmol/L (ref 22–32)
Calcium: 8.4 mg/dL — ABNORMAL LOW (ref 8.9–10.3)
Chloride: 106 mmol/L (ref 98–111)
Creatinine, Ser: 1.21 mg/dL (ref 0.61–1.24)
GFR, Estimated: 58 mL/min — ABNORMAL LOW (ref >60)
Glucose, Bld: 68 mg/dL — ABNORMAL LOW (ref 70–99)
Potassium: 4.5 mmol/L (ref 3.5–5.1)
Sodium: 138 mmol/L (ref 135–145)

## 2021-05-16 LAB — CBC
HCT: 31.1 % — ABNORMAL LOW (ref 39.0–52.0)
Hemoglobin: 10.5 g/dL — ABNORMAL LOW (ref 13.0–17.0)
MCH: 31.7 pg (ref 26.0–34.0)
MCHC: 33.8 g/dL (ref 30.0–36.0)
MCV: 94 fL (ref 80.0–100.0)
Platelets: 118 10*3/uL — ABNORMAL LOW (ref 150–400)
RBC: 3.31 MIL/uL — ABNORMAL LOW (ref 4.22–5.81)
RDW: 16.2 % — ABNORMAL HIGH (ref 11.5–15.5)
WBC: 2.7 10*3/uL — ABNORMAL LOW (ref 4.0–10.5)
nRBC: 0 % (ref 0.0–0.2)

## 2021-05-16 MED ORDER — CEFUROXIME AXETIL 500 MG PO TABS: 500.0000 mg | ORAL_TABLET | Freq: Two times a day (BID) | ORAL | 0 refills | Status: AC

## 2021-05-16 MED ORDER — AZITHROMYCIN 500 MG PO TABS
500.0000 mg | ORAL_TABLET | Freq: Every day | ORAL | 0 refills | Status: DC
Start: 2021-05-16 — End: 2023-02-11

## 2021-05-16 MED ORDER — ENSURE ENLIVE PO LIQD: 237.0000 mL | Freq: Three times a day (TID) | ORAL | 0 refills | Status: AC

## 2021-05-16 MED ORDER — ISOSORBIDE MONONITRATE ER 30 MG PO TB24: 15.0000 mg | ORAL_TABLET | Freq: Every day | ORAL | 0 refills | Status: DC

## 2021-05-16 MED ORDER — LEVOTHYROXINE SODIUM 112 MCG PO TABS: 112.0000 ug | ORAL_TABLET | Freq: Every day | ORAL | 0 refills | Status: DC

## 2021-05-16 NOTE — Progress Notes (Signed)
Physical Therapy Treatment Patient Details Name: Tyrone Keith MRN: [id number] DOB: 23-Nov-1932 Today's Date: 05/16/2021   History of Present Illness Pt is an 87 y.o. male presenting to hospital with concerns of increased weakness and difficulty walking; also significant weight loss and c/o being cold all the time; also has had a cough; some slurred speech noted. (-) CT head for acute intracranial abnormality.  Pt admitted with bibasal community acquired PNA (likely bacterial), severe hypothyroidism (likely secondary to noncompliance), and sinus bradycardia.  PMH includes acute kidney failure, a-fib, CAD, Graves disease, htn, nocturia, chest pain, h/o eye surgery, s/p thyroidectomy.    PT Comments    Patient received in bed, he is agreeable to PT, wants to go home. He is mod independent with bed mobility. Stood at toilet independently to urinate. Ambulated 250 feet without AD, min guard and up/down 6 steps with min guard and single rail. Patient will continue to benefit from skilled PT while here to improve safety with mobility.      Recommendations for follow up therapy are one component of a multi-disciplinary discharge planning process, led by the attending physician.  Recommendations may be updated based on patient status, additional functional criteria and insurance authorization.  Follow Up Recommendations  Home health PT;Supervision - Intermittent     Equipment Recommendations  Cane;Rolling walker with 5" wheels    Recommendations for Other Services       Precautions / Restrictions Precautions Precautions: Fall Restrictions Weight Bearing Restrictions: No     Mobility  Bed Mobility Overal bed mobility: Modified Independent Bed Mobility: Supine to Sit;Sit to Supine     Supine to sit: Modified independent (Device/Increase time) Sit to supine: Modified independent (Device/Increase time)        Transfers Overall transfer level: Needs assistance Equipment used:  None Transfers: Sit to/from Stand   Stand pivot transfers: Supervision          Ambulation/Gait Ambulation/Gait assistance: Min guard Gait Distance (Feet): 250 Feet Assistive device: None Gait Pattern/deviations: Step-through pattern;Decreased step length - right;Decreased step length - left;Shuffle;Trunk flexed Gait velocity: decreased   General Gait Details: ambulated without AD, mildly unsteady.   Stairs Stairs: Yes Stairs assistance: Min guard Stair Management: Forwards;One rail Left;Alternating pattern Number of Stairs: 6 General stair comments: generally safe with supervision/min guard   Wheelchair Mobility    Modified Rankin (Stroke Patients Only)       Balance Overall balance assessment: Mild deficits observed, not formally tested Sitting-balance support: Feet supported Sitting balance-Leahy Scale: Good     Standing balance support: No upper extremity supported;During functional activity Standing balance-Leahy Scale: Fair Standing balance comment: mildly unsteady, may benefit from cane                            Cognition Arousal/Alertness: Awake/alert Behavior During Therapy: WFL for tasks assessed/performed Overall Cognitive Status: Within Functional Limits for tasks assessed                                 General Comments: Pt is alert and oriented x 4 but requires increased time to process information. He does follow commands.      Exercises      General Comments        Pertinent Vitals/Pain Pain Assessment: No/denies pain    Home Living  Prior Function            PT Goals (current goals can now be found in the care plan section) Acute Rehab PT Goals Patient Stated Goal: to go home PT Goal Formulation: With patient Time For Goal Achievement: 05/28/21 Potential to Achieve Goals: Good Progress towards PT goals: Progressing toward goals    Frequency    Min 2X/week       PT Plan Discharge plan needs to be updated    Co-evaluation              AM-PAC PT "6 Clicks" Mobility   Outcome Measure  Help needed turning from your back to your side while in a flat bed without using bedrails?: None Help needed moving from lying on your back to sitting on the side of a flat bed without using bedrails?: None Help needed moving to and from a bed to a chair (including a wheelchair)?: A Little Help needed standing up from a chair using your arms (e.g., wheelchair or bedside chair)?: A Little Help needed to walk in hospital room?: A Little Help needed climbing 3-5 steps with a railing? : A Little 6 Click Score: 20    End of Session Equipment Utilized During Treatment: Gait belt Activity Tolerance: Patient tolerated treatment well Patient left: in bed;with call bell/phone within reach;with bed alarm set Nurse Communication: Mobility status PT Visit Diagnosis: Muscle weakness (generalized) (M62.81);Unsteadiness on feet (R26.81)     Time: 1005-1026 PT Time Calculation (min) (ACUTE ONLY): 21 min  Charges:  $Gait Training: 8-22 mins                     Chick Cousins, PT, GCS 05/16/21,10:35 AM

## 2021-05-16 NOTE — Discharge Summary (Signed)
[facility] [place] [facility]   PATIENT NAME: Tyrone Keith    MR#:  [id number]  DATE OF BIRTH:  June 26, 1933  DATE OF ADMISSION:  05/13/2021 ADMITTING PHYSICIAN: Hannah Beat, MD  DATE OF DISCHARGE: 05/16/2021  2:32 PM  PRIMARY CARE PHYSICIAN: Kandyce Rud, MD    ADMISSION DIAGNOSIS:  Weakness [R53.1] CAP (community acquired pneumonia) [J18.9] Community acquired pneumonia, unspecified laterality [J18.9]  DISCHARGE DIAGNOSIS:  Active Problems:   CAP (community acquired pneumonia)   Protein-calorie malnutrition, severe   SECONDARY DIAGNOSIS:   Past Medical History:  Diagnosis Date  . Abnormal weight loss   . Acute kidney failure (HCC)   . Atrial fibrillation (HCC)   . BPH (benign prostatic hyperplasia) 12/19/2014  . CAD (coronary artery disease)   . Elevated PSA   . Epididymitis   . Graves disease   . Graves disease   . Graves disease   . Hypertension   . Nocturia 12/19/2014  . Postoperative primary hypothyroidism     HOSPITAL COURSE:   Bilateral lower lobe pneumonia.  Patient received Rocephin and Zithromax IV here in the hospital.  Will prescribe 1 more day of Zithromax upon going home and 5 more doses of cefuroxime. Hypothyroidism unspecified with noncompliance.  TSH elevated at 49.  Restart Synthroid.  Recommend rechecking thyroid function test in 6 weeks. Pancytopenia.  Hepatitis C added onto labs and still pending at this time.  White blood cell count 2.7, hemoglobin 10.5, platelet count 118.  Will refer to hematology as outpatient. Sinus bradycardia.  Discontinue beta-blocker Paroxysmal atrial fibrillation.  Not on any anticoagulation secondary to high fall risk.  Hold Coreg. Severe protein calorie malnutrition.  Continue supplements  DISCHARGE CONDITIONS:   Satisfactory  CONSULTS OBTAINED:    None  DRUG ALLERGIES:  No Known Allergies  DISCHARGE MEDICATIONS:   Allergies as of 05/16/2021   No Known Allergies       Medication List     STOP taking these medications    atorvastatin 40 MG tablet Commonly known as: LIPITOR   carvedilol 3.125 MG tablet Commonly known as: COREG       TAKE these medications    aspirin EC 81 MG tablet Take 1 tablet (81 mg total) by mouth daily. Swallow whole.   azithromycin 500 MG tablet Commonly known as: ZITHROMAX Take 1 tablet (500 mg total) by mouth daily.   cefUROXime 500 MG tablet Commonly known as: CEFTIN Take 1 tablet (500 mg total) by mouth 2 (two) times daily with a meal for 5 doses.   feeding supplement Liqd Take 237 mLs by mouth 3 (three) times daily.   isosorbide mononitrate 30 MG 24 hr tablet Commonly known as: IMDUR Take 0.5 tablets (15 mg total) by mouth daily.   levothyroxine 112 MCG tablet Commonly known as: SYNTHROID Take 1 tablet (112 mcg total) by mouth daily before breakfast.         DISCHARGE INSTRUCTIONS:   Follow-up PMD 5 days Followed by hematology 2 weeks  If you experience worsening of your admission symptoms, develop shortness of breath, life threatening emergency, suicidal or homicidal thoughts you must seek medical attention immediately by calling 911 or calling your MD immediately  if symptoms less severe.  You Must read complete instructions/literature along with all the possible adverse reactions/side effects for all the Medicines you take and that have been prescribed to you. Take any new Medicines after you have completely understood and accept all the possible adverse reactions/side effects.  Please note  You were cared for by a hospitalist during your hospital stay. If you have any questions about your discharge medications or the care you received while you were in the hospital after you are discharged, you can call the unit and asked to speak with the hospitalist on call if the hospitalist that took care of you is not available. Once you are discharged, your primary care physician will handle any further  medical issues. Please note that NO REFILLS for any discharge medications will be authorized once you are discharged, as it is imperative that you return to your primary care physician (or establish a relationship with a primary care physician if you do not have one) for your aftercare needs so that they can reassess your need for medications and monitor your lab values.    Today   CHIEF COMPLAINT:   Chief Complaint  Patient presents with  . Weakness    HISTORY OF PRESENT ILLNESS:  Tyrone Keith  is a 87 y.o. male came in with weakness   VITAL SIGNS:  Blood pressure 98/63, pulse 60, temperature 98 F (36.7 C), resp. rate 16, height 6\' 1"  (1.854 m), weight 72.9 kg, SpO2 100 %.  I/O:   Intake/Output Summary (Last 24 hours) at 05/16/2021 1708 Last data filed at 05/16/2021 1228 Gross per 24 hour  Intake 1342.5 ml  Output 1055 ml  Net 287.5 ml    PHYSICAL EXAMINATION:  GENERAL:  87 y.o.-year-old patient lying in the bed with no acute distress.  EYES: Pupils equal, round, reactive to light and accommodation. No scleral icterus.  HEENT: Head atraumatic, normocephalic. Oropharynx and nasopharynx clear.  LUNGS: Normal breath sounds bilaterally, no wheezing, rales,rhonchi or crepitation. No use of accessory muscles of respiration.  CARDIOVASCULAR: S1, S2 normal. No murmurs, rubs, or gallops.  ABDOMEN: Soft, non-tender, non-distended. Bowel sounds present. No organomegaly or mass.  EXTREMITIES: trace pedal edema.  NEUROLOGIC: Cranial nerves II through XII are intact. Muscle strength 5/5 in all extremities. Sensation intact. Gait not checked.  PSYCHIATRIC: The patient is alert and answers questions appropriately SKIN: No obvious rash, lesion, or ulcer.   DATA REVIEW:   CBC Recent Labs  Lab 05/16/21 0604  WBC 2.7*  HGB 10.5*  HCT 31.1*  PLT 118*    Chemistries  Recent Labs  Lab 05/16/21 0604  NA 138  K 4.5  CL 106  CO2 26  GLUCOSE 68*  BUN 20  CREATININE 1.21   CALCIUM 8.4*     Microbiology Results  Results for orders placed or performed during the hospital encounter of 05/13/21  Blood culture (routine x 2)     Status: None (Preliminary result)   Collection Time: 05/13/21  7:55 PM   Specimen: BLOOD  Result Value Ref Range Status   Specimen Description BLOOD RIGHT ANTECUBITAL  Final   Special Requests   Final    BOTTLES DRAWN AEROBIC AND ANAEROBIC Blood Culture adequate volume   Culture   Final    NO GROWTH 3 DAYS Performed at [facility], [address] [place], [place]    Report Status PENDING  Incomplete  Blood culture (routine x 2)     Status: None (Preliminary result)   Collection Time: 05/13/21  7:55 PM   Specimen: BLOOD  Result Value Ref Range Status   Specimen Description BLOOD BLOOD LEFT FOREARM  Final   Special Requests   Final    BOTTLES DRAWN AEROBIC AND ANAEROBIC Blood Culture results may not be  optimal due to an inadequate volume of blood received in culture bottles   Culture   Final    NO GROWTH 3 DAYS Performed at [facility], [address] [place], [place] [address]    Report Status PENDING  Incomplete  Resp Panel by RT-PCR (Flu A&B, Covid) Nasopharyngeal Swab     Status: None   Collection Time: 05/13/21  7:55 PM   Specimen: Nasopharyngeal Swab; Nasopharyngeal(NP) swabs in vial transport medium  Result Value Ref Range Status   SARS Coronavirus 2 by RT PCR NEGATIVE NEGATIVE Final    Comment: (NOTE) SARS-CoV-2 target nucleic acids are NOT DETECTED.  The SARS-CoV-2 RNA is generally detectable in upper respiratory specimens during the acute phase of infection. The lowest concentration of SARS-CoV-2 viral copies this assay can detect is 138 copies/mL. A negative result does not preclude SARS-Cov-2 infection and should not be used as the sole basis for treatment or other patient management decisions. A negative result may occur with  improper specimen collection/handling, submission  of specimen other than nasopharyngeal swab, presence of viral mutation(s) within the areas targeted by this assay, and inadequate number of viral copies(<138 copies/mL). A negative result must be combined with clinical observations, patient history, and epidemiological information. The expected result is Negative.  Fact Sheet for Patients:  BloggerCourse.com  Fact Sheet for Healthcare Providers:  SeriousBroker.it  This test is no t yet approved or cleared by the [place] FDA and  has been authorized for detection and/or diagnosis of SARS-CoV-2 by FDA under an Emergency Use Authorization (EUA). This EUA will remain  in effect (meaning this test can be used) for the duration of the COVID-19 declaration under Section 564(b)(1) of the Act, 21 U.S.C.section 360bbb-3(b)(1), unless the authorization is terminated  or revoked sooner.       Influenza A by PCR NEGATIVE NEGATIVE Final   Influenza B by PCR NEGATIVE NEGATIVE Final    Comment: (NOTE) The Xpert Xpress SARS-CoV-2/FLU/RSV plus assay is intended as an aid in the diagnosis of influenza from Nasopharyngeal swab specimens and should not be used as a sole basis for treatment. Nasal washings and aspirates are unacceptable for Xpert Xpress SARS-CoV-2/FLU/RSV testing.  Fact Sheet for Patients: BloggerCourse.com  Fact Sheet for Healthcare Providers: SeriousBroker.it  This test is not yet approved or cleared by the [place] FDA and has been authorized for detection and/or diagnosis of SARS-CoV-2 by FDA under an Emergency Use Authorization (EUA). This EUA will remain in effect (meaning this test can be used) for the duration of the COVID-19 declaration under Section 564(b)(1) of the Act, 21 U.S.C. section 360bbb-3(b)(1), unless the authorization is terminated or revoked.  Performed at [facility], [address] [place], [place] [address]   Expectorated Sputum Assessment w Gram Stain, Rflx to Resp Cult     Status: None   Collection Time: 05/15/21  1:32 AM   Specimen: Sputum  Result Value Ref Range Status   Specimen Description SPUTUM  Final   Special Requests NONE  Final   Sputum evaluation   Final    Sputum specimen not acceptable for testing.  Please recollect.   C/SASHONIE TAYLOR@[id number] /11/22 RH Performed at [facility], [address], [place], [place] [address]    Report Status 05/15/2021 FINAL  Final       Management plans discussed with the patient, family and they are in agreement.  CODE STATUS:     Code Status Orders  (From admission, onward)  Start     Ordered   05/13/21 2004  Full code  Continuous        05/13/21 2005           Code Status History     This patient has a current code status but no historical code status.       TOTAL TIME TAKING CARE OF THIS PATIENT: 34 minutes.    Alford Highland M.D on 05/16/2021 at 5:08 PM   [facility]  CC: Primary care physician; Kandyce Rud, MD

## 2021-05-18 LAB — CULTURE, BLOOD (ROUTINE X 2)
Culture: NO GROWTH
Culture: NO GROWTH
Special Requests: ADEQUATE

## 2021-05-18 LAB — HEPATITIS C ANTIBODY: HCV Ab: <0.1 s/co ratio — AB (ref 0.0–0.9)

## 2021-12-06 ENCOUNTER — Encounter: Payer: Self-pay | Admitting: Medical

## 2021-12-06 ENCOUNTER — Ambulatory Visit (INDEPENDENT_AMBULATORY_CARE_PROVIDER_SITE_OTHER): Payer: Medicare Other | Admitting: Medical

## 2021-12-06 VITALS — BP 120/54 | HR 64 | Ht 70.0 in | Wt 151.5 lb

## 2021-12-06 DIAGNOSIS — I480 Paroxysmal atrial fibrillation: Secondary | ICD-10-CM

## 2021-12-06 DIAGNOSIS — E782 Mixed hyperlipidemia: Secondary | ICD-10-CM | POA: Diagnosis not present

## 2021-12-06 DIAGNOSIS — I25118 Atherosclerotic heart disease of native coronary artery with other forms of angina pectoris: Secondary | ICD-10-CM | POA: Diagnosis not present

## 2021-12-06 DIAGNOSIS — I318 Other specified diseases of pericardium: Secondary | ICD-10-CM

## 2021-12-06 NOTE — Patient Instructions (Signed)
Medication Instructions:   Your physician recommends that you continue on your current medications as directed. Please refer to the Current Medication list given to you today.   *If you need a refill on your cardiac medications before your next appointment, please call your pharmacy*   Lab Work: None ordered  If you have labs (blood work) drawn today and your tests are completely normal, you will receive your results only by: MyChart Message (if you have MyChart) OR A paper copy in the mail If you have any lab test that is abnormal or we need to change your treatment, we will call you to review the results.   Testing/Procedures: None ordered   Follow-Up: At [facility], you and your health needs are our priority.  As part of our continuing mission to provide you with exceptional heart care, we have created designated Provider Care Teams.  These Care Teams include your primary Cardiologist (physician) and Advanced Practice Providers (APPs -  Physician Assistants and Nurse Practitioners) who all work together to provide you with the care you need, when you need it.  We recommend signing up for the patient portal called "MyChart".  Sign up information is provided on this After Visit Summary.  MyChart is used to connect with patients for Virtual Visits (Telemedicine).  Patients are able to view lab/test results, encounter notes, upcoming appointments, etc.  Non-urgent messages can be sent to your provider as well.   To learn more about what you can do with MyChart, go to [email or web address]://www.mychart.com.    Your next appointment:    Your physician wants you to follow-up in: 1 year.   You will receive a reminder letter in the mail two months in advance. If you don't receive a letter, please call our office to schedule the follow-up appointment.   The format for your next appointment:   In Person  Provider:   You may see Christopher End, MD or one of the following Advanced Practice Providers  on your designated Care Team:   Christopher Berge, NP Ryan Dunn, PA-C Cadence Furth, PA-C   Other Instructions N/A  Important Information About Sugar       

## 2021-12-06 NOTE — Progress Notes (Signed)
?Cardiology Office Note:   ? ?Date:  12/06/2021  ? ?ID:  Moultrie Omary, DOB Sep 22, 1932, MRN [id number] ? ?PCP:  Derinda Late, MD  ?[facility] Cardiologist:  Nelva Bush, MD  ?[facility] Electrophysiologist:  None  ? ?Referring MD: Derinda Late, MD  ? ?Chief Complaint: 6 month follow-up ? ?History of Present Illness:   ? ?Tyrone Keith is a 88 y.o. male with a hx of  with history of coronary artery disease, pericardial calcification (incidentally noted), atrial fibrillation, hypertension, Graves' disease with postoperative hypothyroidism, and BPH, who presents for follow-up of coronary artery disease and pericardial calcification. ? ?Last seen 12/20/20 and was overall doing well with stable angina. Unclear h/o afib OAC deferred, and he was started on Asirin 81mg  daily.  ? ?Today, the patient has been doing well. No chest pain, SOB, LLE, orthopnea, pnd, palpitations, lightheadedness or dizziness. Eating and drinking normally. Labs in February reviewed, no concerns. He is not very active, but very functional. Does not use cane or walker. EKG shows NSR with PVCs. He is off Coreg, not sure why. May be from ?bradycardia. PCP notes discuss initiation of propranolol for tremors.  ? ?Past Medical History:  ?Diagnosis Date  ? Abnormal weight loss   ? Acute kidney failure ([place])   ? Atrial fibrillation ([place])   ? BPH (benign prostatic hyperplasia) 12/19/2014  ? CAD (coronary artery disease)   ? Elevated PSA   ? Epididymitis   ? Graves disease   ? Graves disease   ? Graves disease   ? Hypertension   ? Nocturia 12/19/2014  ? Postoperative primary hypothyroidism   ? ? ?Past Surgical History:  ?Procedure Laterality Date  ? EYE SURGERY    ? TOTAL THYROIDECTOMY    ? ? ?Current Medications: ?Current Meds  ?Medication Sig  ? aspirin EC 81 MG tablet Take 1 tablet (81 mg total) by mouth daily. Swallow whole.  ? atorvastatin (LIPITOR) 20 MG tablet Take by mouth.  ? feeding supplement (ENSURE ENLIVE / ENSURE PLUS) LIQD Take 237  mLs by mouth 3 (three) times daily.  ? isosorbide mononitrate (IMDUR) 30 MG 24 hr tablet Take 0.5 tablets (15 mg total) by mouth daily.  ? levothyroxine (SYNTHROID) 112 MCG tablet Take 1 tablet (112 mcg total) by mouth daily before breakfast.  ?  ? ?Allergies:   Patient has no known allergies.  ? ?Social History  ? ?Socioeconomic History  ? Marital status: Married  ?  Spouse name: Not on file  ? Number of children: Not on file  ? Years of education: Not on file  ? Highest education level: Not on file  ?Occupational History  ? Not on file  ?Tobacco Use  ? Smoking status: Never  ? Smokeless tobacco: Never  ?Vaping Use  ? Vaping Use: Never used  ?Substance and Sexual Activity  ? Alcohol use: No  ?  Alcohol/week: 0.0 standard drinks  ? Drug use: No  ? Sexual activity: Yes  ?Other Topics Concern  ? Not on file  ?Social History Narrative  ? Not on file  ? ?Social Determinants of Health  ? ?Financial Resource Strain: Not on file  ?Food Insecurity: Not on file  ?Transportation Needs: Not on file  ?Physical Activity: Not on file  ?Stress: Not on file  ?Social Connections: Not on file  ?  ? ?Family History: ?The patient's family history includes Heart disease in his mother; Prostate cancer in his brother. There is no history of Bladder Cancer or Kidney cancer. ? ?  ROS:   ?Please see the history of present illness.    ? All other systems reviewed and are negative. ? ?EKGs/Labs/Other Studies Reviewed:   ? ?The following studies were reviewed today: ? ?Echo 2021 ? 1. Left ventricular ejection fraction, by visual estimation, is 55%. The  ?left ventricle has normal function. There is no left ventricular  ?hypertrophy.  ? 2. Left ventricular diastolic parameters are consistent with Grade I  ?diastolic dysfunction (impaired relaxation).  ? 3. The left ventricle has no regional wall motion abnormalities.  ? 4. Global right ventricle has normal systolic function.The right  ?ventricular size is normal. No increase in right ventricular  wall  ?thickness.  ? 5. Left atrial size was normal.  ? 6. Aortic valve regurgitation is mild to moderate. Mild to moderate  ?aortic valve sclerosis/calcification without any evidence of aortic  ?stenosis.  ? 7. TR signal is inadequate for assessing pulmonary artery systolic  ?pressure.  ? 8. The inferior vena cava is dilated in size with <50% respiratory  ?variability, suggesting right atrial pressure of 15 mmHg. ? ?Cardiac CTA 2020 ?FINDINGS: ?FFR 0.8 in the proximal LCx and 0.75 in the mid LCx. ?  ?IMPRESSION: ?Hemodynamically significant ostial and proximal LCx stenosis. ?  ?Loralie Champagne ? ?  ?IMPRESSION: ?1. Spectrum of findings at the lung bases that may indicate a ?fibrotic interstitial lung disease. If there is clinical concern for ?interstitial lung disease, a dedicated high-resolution chest CT ?study may be obtained for further evaluation. ?2. Chronic pericardial calcification. ?3.  Aortic Atherosclerosis (ICD10-I70.0). ? ?IMPRESSION: ?1. Coronary artery calcium score 250 Agatston units. This places the ?patient in the 52nd percentile for age and gender, suggesting ?intermediate risk for future cardiac events. ?  ?2.  Nonobstructive disease in the LAD. ?  ?3. Ostial LCx stenosis appears to be around 50%, but will send for ?FFR to confirm. ?  ?4.  Pericardial calcification noted, especially adjacent to the RV. ?  ?Loralie Champagne ?  ?Electronically Signed: ?By: Loralie Champagne M.D. ?On: 05/27/2019 11:46 ? ?EKG:  EKG is  ordered today.  The ekg ordered today demonstrates NSR 64bpm, PVC, LAD, nonspecific T wave changes ? ?Recent Labs: ?02/02/2021: ALT 87 ?05/13/2021: TSH 49.000 ?05/16/2021: BUN 20; Creatinine, Ser 1.21; Hemoglobin 10.5; Platelets 118; Potassium 4.5; Sodium 138  ?Recent Lipid Panel ?   ?Component Value Date/Time  ? CHOL 214 (H) 02/02/2021 1437  ? CHOL 177 12/20/2020 1045  ? TRIG 80 02/02/2021 1437  ? HDL 74 02/02/2021 1437  ? HDL 70 12/20/2020 1045  ? CHOLHDL 2.9 02/02/2021 1437  ? VLDL 16  02/02/2021 1437  ? [place] 124 (H) 02/02/2021 1437  ? [place] 96 12/20/2020 1045  ? ? ? ?Physical Exam:   ? ?VS:  BP (!) 120/54 (BP Location: Left Arm, Patient Position: Sitting, Cuff Size: Normal)   Pulse 64   Ht 5\' 10"  (1.778 m)   Wt 151 lb 8 oz (68.7 kg)   SpO2 98%   BMI 21.74 kg/m?    ? ?Wt Readings from Last 3 Encounters:  ?12/06/21 151 lb 8 oz (68.7 kg)  ?05/13/21 160 lb 11.5 oz (72.9 kg)  ?12/20/20 152 lb (68.9 kg)  ?  ? ?GEN:  Well nourished, well developed in no acute distress ?HEENT: Normal ?NECK: No JVD; No carotid bruits ?LYMPHATICS: No lymphadenopathy ?CARDIAC: RRR, no murmurs, rubs, gallops ?RESPIRATORY:  Clear to auscultation without rales, wheezing or rhonchi  ?ABDOMEN: Soft, non-tender, non-distended ?MUSCULOSKELETAL:  No edema; No deformity  ?  SKIN: Warm and dry ?NEUROLOGIC:  Alert and oriented x 3 ?PSYCHIATRIC:  Normal affect  ? ?ASSESSMENT:   ? ?1. AF (paroxysmal atrial fibrillation) ([place])   ?2. Coronary artery disease of native artery of native heart with stable angina pectoris ([place])   ?3. Paroxysmal atrial fibrillation ([place])   ?4. Pericardial calcification   ?5. Hyperlipidemia, mixed   ? ?PLAN:   ? ?In order of problems listed above: ? ?CAD with stable angina ?No anginal symptoms reported. Continue Aspirin 81mg  daily, Imdur 15mg  daily. He is off Coreg, unclear reason as to why. He has been having tremors per PCP note. It is OK to start propranolol in place of Coreg. No further ischemic work-up indicated.  ? ?Pericardial calcification ?No signs of symptoms of constrictive pericarditis. He has h/o TB, which is suspected to be the cause of pericardial calcification. No further work-up at this time.  ? ?Paroxysmal Afib ?Unclear details surrounding diagnosis. Afib found on EKG in 2008 with unclear details surrounding incidence. No further reoccurrence known. Not on a/c. He is on Aspirin 81mg  daily.  ? ?HLD ?LDL 124, continue Lipitor 20mg  daily. Can increase in the future. ? ?Disposition: Follow  up in 1 year(s) with MD/APP  ? ? ?Signed, ?Donyel Castagnola Ninfa Meeker, PA-C  ?12/06/2021 11:35 AM    ?[facility]  ?

## 2022-01-17 ENCOUNTER — Other Ambulatory Visit: Payer: Self-pay | Admitting: Internal Medicine

## 2022-11-21 ENCOUNTER — Other Ambulatory Visit: Payer: Self-pay | Admitting: *Deleted

## 2022-11-21 MED ORDER — ATORVASTATIN CALCIUM 20 MG PO TABS: ORAL_TABLET | ORAL | 0 refills | Status: DC

## 2022-11-25 ENCOUNTER — Other Ambulatory Visit: Payer: Self-pay | Admitting: *Deleted

## 2022-11-25 MED ORDER — ATORVASTATIN CALCIUM 20 MG PO TABS: ORAL_TABLET | ORAL | 0 refills | Status: AC

## 2023-01-25 ENCOUNTER — Emergency Department: Payer: Medicare Other

## 2023-01-25 ENCOUNTER — Emergency Department
Admission: EM | Admit: 2023-01-25 | Discharge: 2023-01-25 | Disposition: A | Discharge status: Home / Self Care | Payer: Medicare Other | Attending: Student in an Organized Health Care Education/Training Program | Admitting: Student in an Organized Health Care Education/Training Program

## 2023-01-25 DIAGNOSIS — F0280 Dementia in other diseases classified elsewhere without behavioral disturbance: Secondary | ICD-10-CM | POA: Diagnosis not present

## 2023-01-25 DIAGNOSIS — N4889 Other specified disorders of penis: Secondary | ICD-10-CM | POA: Diagnosis not present

## 2023-01-25 DIAGNOSIS — G309 Alzheimer's disease, unspecified: Secondary | ICD-10-CM | POA: Diagnosis not present

## 2023-01-25 DIAGNOSIS — N401 Enlarged prostate with lower urinary tract symptoms: Secondary | ICD-10-CM

## 2023-01-25 DIAGNOSIS — R109 Unspecified abdominal pain: Secondary | ICD-10-CM | POA: Insufficient documentation

## 2023-01-25 DIAGNOSIS — R350 Frequency of micturition: Secondary | ICD-10-CM | POA: Diagnosis present

## 2023-01-25 HISTORY — DX: Dementia in other diseases classified elsewhere, unspecified severity, without behavioral disturbance, psychotic disturbance, mood disturbance, and anxiety: F02.80

## 2023-01-25 LAB — URINALYSIS, ROUTINE W REFLEX MICROSCOPIC
Bacteria, UA: NONE SEEN
Bilirubin Urine: NEGATIVE
Glucose, UA: NEGATIVE mg/dL
Ketones, ur: NEGATIVE mg/dL
Leukocytes,Ua: NEGATIVE
Nitrite: NEGATIVE
Protein, ur: NEGATIVE mg/dL
RBC / HPF: >50 RBC/hpf (ref 0–5)
Specific Gravity, Urine: 1.016 (ref 1.005–1.030)
Squamous Epithelial / HPF: NONE SEEN /HPF (ref 0–5)
pH: 5 (ref 5.0–8.0)

## 2023-01-25 LAB — COMPREHENSIVE METABOLIC PANEL
ALT: 14 U/L (ref 0–44)
AST: 24 U/L (ref 15–41)
Albumin: 3.8 g/dL (ref 3.5–5.0)
Alkaline Phosphatase: 59 U/L (ref 38–126)
Anion gap: 10 (ref 5–15)
BUN: 18 mg/dL (ref 8–23)
CO2: 23 mmol/L (ref 22–32)
Calcium: 8.5 mg/dL — ABNORMAL LOW (ref 8.9–10.3)
Chloride: 107 mmol/L (ref 98–111)
Creatinine, Ser: 1.46 mg/dL — ABNORMAL HIGH (ref 0.61–1.24)
GFR, Estimated: 46 mL/min — ABNORMAL LOW (ref >60)
Glucose, Bld: 127 mg/dL — ABNORMAL HIGH (ref 70–99)
Potassium: 3.9 mmol/L (ref 3.5–5.1)
Sodium: 140 mmol/L (ref 135–145)
Total Bilirubin: 0.8 mg/dL (ref 0.3–1.2)
Total Protein: 6.8 g/dL (ref 6.5–8.1)

## 2023-01-25 LAB — CBC WITH DIFFERENTIAL/PLATELET
Abs Immature Granulocytes: 0.02 10*3/uL (ref 0.00–0.07)
Basophils Absolute: 0 10*3/uL (ref 0.0–0.1)
Basophils Relative: 0 %
Eosinophils Absolute: 0 10*3/uL (ref 0.0–0.5)
Eosinophils Relative: 0 %
HCT: 42.8 % (ref 39.0–52.0)
Hemoglobin: 13.8 g/dL (ref 13.0–17.0)
Immature Granulocytes: 0 %
Lymphocytes Relative: 6 %
Lymphs Abs: 0.4 10*3/uL — ABNORMAL LOW (ref 0.7–4.0)
MCH: 31.5 pg (ref 26.0–34.0)
MCHC: 32.2 g/dL (ref 30.0–36.0)
MCV: 97.7 fL (ref 80.0–100.0)
Monocytes Absolute: 0.5 10*3/uL (ref 0.1–1.0)
Monocytes Relative: 9 %
Neutro Abs: 5.4 10*3/uL (ref 1.7–7.7)
Neutrophils Relative %: 85 %
Platelets: 145 10*3/uL — ABNORMAL LOW (ref 150–400)
RBC: 4.38 MIL/uL (ref 4.22–5.81)
RDW: 13.4 % (ref 11.5–15.5)
WBC: 6.4 10*3/uL (ref 4.0–10.5)
nRBC: 0 % (ref 0.0–0.2)

## 2023-01-25 MED ORDER — BUSPIRONE HCL 5 MG PO TABS
15.0000 mg | ORAL_TABLET | Freq: Once | ORAL | Status: AC
  Administered 2023-01-25: 15 mg via ORAL
  Filled 2023-01-25: qty 3

## 2023-01-25 MED ORDER — ATORVASTATIN CALCIUM 20 MG PO TABS
20.0000 mg | ORAL_TABLET | Freq: Once | ORAL | Status: AC
  Administered 2023-01-25: 20 mg via ORAL
  Filled 2023-01-25: qty 1

## 2023-01-25 MED ORDER — TAMSULOSIN HCL 0.4 MG PO CAPS
0.4000 mg | ORAL_CAPSULE | Freq: Once | ORAL | Status: AC
  Administered 2023-01-25: 0.4 mg via ORAL
  Filled 2023-01-25: qty 1

## 2023-01-25 MED ORDER — LEVOTHYROXINE SODIUM 100 MCG PO TABS
100.0000 ug | ORAL_TABLET | Freq: Once | ORAL | Status: AC
  Administered 2023-01-25: 100 ug via ORAL
  Filled 2023-01-25: qty 1

## 2023-01-25 MED ORDER — TAMSULOSIN HCL 0.4 MG PO CAPS: 0.4000 mg | ORAL_CAPSULE | Freq: Every day | ORAL | 1 refills | Status: DC

## 2023-01-25 MED ORDER — MIRTAZAPINE 15 MG PO TABS
15.0000 mg | ORAL_TABLET | Freq: Every day | ORAL | Status: DC
  Administered 2023-01-25: 15 mg via ORAL
  Filled 2023-01-25: qty 1

## 2023-01-25 MED ORDER — GABAPENTIN 100 MG PO CAPS
100.0000 mg | ORAL_CAPSULE | Freq: Once | ORAL | Status: AC
  Administered 2023-01-25: 100 mg via ORAL
  Filled 2023-01-25: qty 1

## 2023-01-25 NOTE — ED Notes (Signed)
Patient's family member has a urine specimen cup, but patient is unable to void at this time.  Patient was able to transfer independently to stretcher.

## 2023-01-25 NOTE — ED Provider Notes (Signed)
   [facility] Provider Note    Event Date/Time   First MD Initiated Contact with Patient 01/25/23 1305     (approximate)   History   Urinary Frequency   HPI  Tyrone Keith is a 89 y.o. male with history of A-fib, Graves' disease, Alzheimer's and as listed in EMR presents to the emergency department for treatment and evaluation of urinary frequency.  According to his wife he was up and down all night and complained of pain in his penis and right flank this morning. Wife states that he has been more weak than usual the past few days and has not wanted to eat hardly anything at all. No suspected fever.      Physical Exam   Triage Vital Signs: ED Triage Vitals [01/25/23 1248]  Enc Vitals Group     BP (!) 145/62     Pulse Rate 82     Resp 17     Temp 97.7 F (36.5 C)     Temp Source Oral     SpO2      Weight 139 lb (63 kg)     Height 6' (1.829 m)     Head Circumference      Peak Flow      Pain Score 2     Pain Loc      Pain Edu?      Excl. in GC?     Most recent vital signs: Vitals:   01/25/23 1248  BP: (!) 145/62  Pulse: 82  Resp: 17  Temp: 97.7 F (36.5 C)    General: Awake, no distress.  CV:  Good peripheral perfusion.  Resp:  Normal effort.  Abd:  No distention. Suprapubic tenderness Other:  Right flank pain   ED Results / Procedures / Treatments   Labs (all labs ordered are listed, but only abnormal results are displayed) Labs Reviewed  URINALYSIS, ROUTINE W REFLEX MICROSCOPIC     EKG   RADIOLOGY  Image and radiology report reviewed and interpreted by me. Radiology report consistent with the same.  Pending  PROCEDURES:  Critical Care performed: No  Procedures   MEDICATIONS ORDERED IN ED:  Medications - No data to display   IMPRESSION / MDM / ASSESSMENT AND PLAN / ED COURSE   I have reviewed the triage note.  Differential diagnosis includes, but is not limited to, pyelonephritis, uti, AKI, kidney  stone.  Patient's presentation is most consistent with acute complicated illness / injury requiring diagnostic workup.  89 year old male presents to the ER for evaluation of urinary frequency, weakness, and decreased appetite.  Labs, urinalysis pending. Care transitioned to J. Cuthriell, PA-C at shift change.      FINAL CLINICAL IMPRESSION(S) / ED DIAGNOSES   Final diagnoses:  None     Rx / DC Orders   ED Discharge Orders     None        Note:  This document was prepared using Dragon voice recognition software and may include unintentional dictation errors.   Chinita Pester, FNP 01/25/23 1507    Georga Hacking, MD 01/25/23 [phone]

## 2023-01-25 NOTE — ED Notes (Signed)
Blood-tinged urine noted in Foley Catheter tube. Gala Romney PA-C aware and is at bedside.

## 2023-01-25 NOTE — ED Provider Notes (Signed)
-----------------------------------------   3:04 PM on 01/25/2023 -----------------------------------------  Blood pressure (!) 145/62, pulse 82, temperature 97.7 F (36.5 C), temperature source Oral, resp. rate 17, height 6' (1.829 m), weight 63 kg.  Assuming care from [facility], FNP.  In short, Tyrone Keith is a 89 y.o. male with a chief complaint of Urinary Frequency .  Refer to the original H&P for additional details.  The current plan of care is to await labs, urinalysis.  Patient presents with urinary frequency, decreased mobility from home.  Patient has had poor appetite.  No reported fevers, URI symptoms, chest pain.  Does have some reported back pain with his urinary frequency.  No reported hematuria.  No other complaints at this time.  Awaiting labs and urinalysis currently.Marland Kitchen  ----------------------------------------- 6:48 PM on 01/25/2023 ----------------------------------------- Patient on further questioning after handoff stated that he has had the urge to urinate very frequently but has not been producing urine for the last 2 days.  At that time, bladder scan was obtained with over 500 mL on the bladder scan.  Foley cath inserted and patient immediately had over 800 mL of urine.  Patient states that the pain that he is feeling in his back has resolved.  Patient does have hematuria on the urinalysis as well as gross hematuria in the Foley bag.  Given his complaints of back pain, hematuria I did recommend CT scan as well as a chest x-ray as the wife states that he has been not eating and drinking as much is normal.  Chest x-ray and CT scan are reassuring without acute findings other than a marked enlargement of the prostate.   ----------------------------------------- 8:09 PM on 01/25/2023 ----------------------------------------- Patient's labs, imaging are all back and other than significant BPH symptoms, no other acute findings.  Had a lengthy discussion with the patient  and wife, they feel comfortable with discharge with Foley cath and with Flomax.  Concerning signs and symptoms such as return of back pain, lower abdominal pain, fevers, inability to maintain hydration or intake of solids are discussed.  At this time however patient's original symptoms have completely resolved with his Foley cath and I do feel that patient is stable for discharge.  They will follow-up with urology or primary care as needed.   ED diagnosis:  BPH with urinary retention   Lanette Hampshire 01/25/23 2011    Georga Hacking, MD 01/26/23 [phone]

## 2023-01-25 NOTE — ED Triage Notes (Signed)
Per wife pt has been having frequent UTI's. Per wife pt was up and down all night using the bathroom.

## 2023-01-25 NOTE — ED Notes (Signed)
IV team at bedside 

## 2023-02-11 ENCOUNTER — Encounter: Payer: Self-pay | Admitting: Urology

## 2023-02-11 ENCOUNTER — Ambulatory Visit: Payer: Medicare Other | Admitting: Urology

## 2023-02-11 VITALS — BP 111/67 | HR 67 | Ht 70.0 in | Wt 145.0 lb

## 2023-02-11 DIAGNOSIS — Z466 Encounter for fitting and adjustment of urinary device: Secondary | ICD-10-CM | POA: Diagnosis not present

## 2023-02-11 DIAGNOSIS — N138 Other obstructive and reflux uropathy: Secondary | ICD-10-CM | POA: Diagnosis not present

## 2023-02-11 DIAGNOSIS — N401 Enlarged prostate with lower urinary tract symptoms: Secondary | ICD-10-CM | POA: Diagnosis not present

## 2023-02-11 LAB — BLADDER SCAN AMB NON-IMAGING

## 2023-02-11 MED ORDER — SULFAMETHOXAZOLE-TRIMETHOPRIM 800-160 MG PO TABS
1.0000 | ORAL_TABLET | Freq: Once | ORAL | Status: AC
Start: 2023-02-11 — End: 2023-02-11
  Administered 2023-02-11: 1 via ORAL

## 2023-02-11 MED ORDER — FINASTERIDE 5 MG PO TABS
5.0000 mg | ORAL_TABLET | Freq: Every day | ORAL | 3 refills | Status: DC
Start: 2023-02-11 — End: 2023-04-04

## 2023-02-11 MED ORDER — TAMSULOSIN HCL 0.4 MG PO CAPS: 0.4000 mg | ORAL_CAPSULE | Freq: Every day | ORAL | 3 refills | Status: DC

## 2023-02-11 NOTE — Progress Notes (Signed)
Catheter Removal  Patient is present today for a catheter removal.  9ml of water was drained from the balloon. A 16FR coude foley cath was removed from the bladder, no complications were noted. Patient tolerated well.  Performed by: Debbe Bales, CMA  Follow up/ Additional notes: RTC this afternoon for PVR

## 2023-02-11 NOTE — Progress Notes (Signed)
02/11/23 10:27 AM   Tyrone Keith 87-02-1933 [id number]  CC: Urinary retention, history of gross hematuria  HPI: I saw Tyrone Keith and his wife today for the above issues, I last saw him in November 2020 for BPH and hematuria, and he did not follow-up as scheduled.  The history today is obtained primarily from the wife secondary to patient's Alzheimer's dementia.  He has a long history of BPH as well as history of gross hematuria with CT urogram and cystoscopy in May 2020 showing a very large prostate measuring 194 g with bleeding noted from the prostate and bladder neck, but no other bladder abnormalities.  He was on maximal medical therapy at that time with Flomax and finasteride, and PVR was normal at 0, he denied any significant urinary symptoms at that time.  He did not follow-up as scheduled, and it sounds like finasteride was discontinued/not refilled at some point, he may have not been on the Flomax either.  History is very challenging to obtain.  He presented to the ER on 01/25/2023 with inability to urinate.  This was preceded by at least 4 to 5 days of worsening urinary frequency and nocturia.  A Foley catheter was placed with return of 800 mL yellow, no evidence of UTI.  A CT abdomen and pelvis with contrast was also performed secondary to some hematuria at the time of catheter placement which showed 150 g prostate, decompressed bladder with no hydronephrosis, no other abnormalities.  Flomax was resumed by the ER at time of catheter placement.  PMH: Past Medical History:  Diagnosis Date   Abnormal weight loss    Acute kidney failure (HCC)    Alzheimer's dementia (HCC)    Atrial fibrillation (HCC)    BPH (benign prostatic hyperplasia) 12/19/2014   CAD (coronary artery disease)    Dementia (HCC)    Elevated PSA    Epididymitis    Graves disease    Graves disease    Graves disease    Hypertension    Nocturia 12/19/2014   Postoperative primary hypothyroidism     Surgical  History: Past Surgical History:  Procedure Laterality Date   EYE SURGERY     TOTAL THYROIDECTOMY       Family History: Family History  Problem Relation Age of Onset   Prostate cancer Brother    Heart disease Mother    Bladder Cancer Neg Hx    Kidney cancer Neg Hx     Social History:  reports that he has never smoked. He has never been exposed to tobacco smoke. He has never used smokeless tobacco. He reports that he does not drink alcohol and does not use drugs.  Physical Exam: BP 111/67   Pulse 67   Ht 5\' 10"  (1.778 m)   Wt 145 lb (65.8 kg)   BMI 20.81 kg/m    Constitutional: Pleasantly confused, flat affect, frail appearing Cardiovascular: No clubbing, cyanosis, or edema. Respiratory: Normal respiratory effort, no increased work of breathing. GI: Abdomen is soft, nontender, nondistended, no abdominal masses   Laboratory Data: Reviewed  Pertinent Imaging: I have personally viewed and interpreted the CT abdomen and pelvis with contrast showing 150 g prostate, bladder decompressed with Foley, no hydronephrosis.  Assessment & Plan:   89 year old comorbid male with worsening dementia, long history of BPH on maximal medical therapy, did not follow-up with urology and prostate medications were discontinued at some point in the last few years, ultimately culminating in urinary retention on 01/25/2023, no worrisome findings on  CT aside from 150 g prostate.  Recommended resuming Flomax and finasteride for maximal medical therapy in setting of chronic BPH with recent retention.  Foley catheter was removed today, Bactrim was given for prophylaxis a.m. and p.m.  He returned this afternoon, has not yet voided, but denies any urge to void, and bladder scan only .  We discussed options including Foley replacement versus ongoing trial of void with close follow-up next week for repeat PVR, and return precautions were discussed extensively.  Using shared decision making, they opted to  defer Foley replacement and continue Flomax and finasteride, risk of retention and infection discussed extensively.  -Flomax and finasteride refilled -Return precautions discussed extensively -RTC 1 week repeat PVR, continue yearly follow-up for PVR   I spent 65 total minutes on the day of the encounter including pre-visit review of the medical record, face-to-face time with the patient, and post visit ordering of labs/imaging/tests.  Legrand Rams, MD 02/11/2023  [facility] [address], [address] [place], [place] [address] [phone]

## 2023-02-12 ENCOUNTER — Encounter: Payer: Self-pay | Admitting: Physician Assistant

## 2023-02-12 ENCOUNTER — Telehealth: Payer: Self-pay | Admitting: Physician Assistant

## 2023-02-12 ENCOUNTER — Other Ambulatory Visit: Payer: Self-pay

## 2023-02-12 ENCOUNTER — Ambulatory Visit (INDEPENDENT_AMBULATORY_CARE_PROVIDER_SITE_OTHER): Payer: Medicare Other | Admitting: Physician Assistant

## 2023-02-12 VITALS — BP 145/73 | HR 81

## 2023-02-12 DIAGNOSIS — R338 Other retention of urine: Secondary | ICD-10-CM

## 2023-02-12 DIAGNOSIS — N138 Other obstructive and reflux uropathy: Secondary | ICD-10-CM

## 2023-02-12 LAB — MICROSCOPIC EXAMINATION

## 2023-02-12 LAB — URINALYSIS, COMPLETE
Bilirubin, UA: NEGATIVE
Glucose, UA: NEGATIVE
Ketones, UA: NEGATIVE
Nitrite, UA: POSITIVE — AB
Protein,UA: NEGATIVE
Specific Gravity, UA: 1.015 (ref 1.005–1.030)
Urobilinogen, Ur: 0.2 mg/dL (ref 0.2–1.0)
pH, UA: 6.5 (ref 5.0–7.5)

## 2023-02-12 LAB — BLADDER SCAN AMB NON-IMAGING

## 2023-02-12 MED ORDER — CEFDINIR 300 MG PO CAPS
300.0000 mg | ORAL_CAPSULE | Freq: Two times a day (BID) | ORAL | 0 refills | Status: DC
Start: 2023-02-12 — End: 2023-03-12

## 2023-02-12 NOTE — Progress Notes (Signed)
02/12/2023 2:02 PM   Tyrone Keith May 14, 1933 [id number]  CC: Chief Complaint  Patient presents with   Urinary Retention   HPI: Tyrone Keith is a 89 y.o. male with PMH BPH with prostatomegaly, Alzheimer's dementia, and gross hematuria with benign workup in 2020 who presents urgently to clinic today with reports of the inability to void since Foley removal with Dr. Richardo Hanks yesterday morning.  He is accompanied today by his wife, who provides the majority of HPI.  Today she reports that he has been unable to void since leaving clinic yesterday.  He was up frequently attempting to void overnight, but she did not observe him actually passing any urine.  Bladder scan on arrival >960mL.  PMH: Past Medical History:  Diagnosis Date   Abnormal weight loss    Acute kidney failure (HCC)    Alzheimer's dementia (HCC)    Atrial fibrillation (HCC)    BPH (benign prostatic hyperplasia) 12/19/2014   CAD (coronary artery disease)    Dementia (HCC)    Elevated PSA    Epididymitis    Graves disease    Graves disease    Graves disease    Hypertension    Nocturia 12/19/2014   Postoperative primary hypothyroidism     Surgical History: Past Surgical History:  Procedure Laterality Date   EYE SURGERY     TOTAL THYROIDECTOMY      Home Medications:  Allergies as of 02/12/2023   No Known Allergies      Medication List        Accurate as of February 12, 2023  2:02 PM. If you have any questions, ask your nurse or doctor.          aspirin EC 81 MG tablet Take 1 tablet (81 mg total) by mouth daily. Swallow whole.   atorvastatin 20 MG tablet Commonly known as: LIPITOR TAKE 1 TABLET(20 MG) BY MOUTH DAILY   busPIRone 15 MG tablet Commonly known as: BUSPAR Take by mouth.   donepezil 10 MG tablet Commonly known as: ARICEPT Take 1 tablet by mouth at bedtime.   feeding supplement Liqd Take 237 mLs by mouth 3 (three) times daily.   finasteride 5 MG tablet Commonly known as:  PROSCAR Take 1 tablet (5 mg total) by mouth daily.   levothyroxine 100 MCG tablet Commonly known as: SYNTHROID Take by mouth.   mirtazapine 15 MG tablet Commonly known as: REMERON Take 1 tablet by mouth at bedtime.   tamsulosin 0.4 MG Caps capsule Commonly known as: FLOMAX Take 1 capsule (0.4 mg total) by mouth daily.   Vitamin D3 10 MCG (400 UNIT) Chew Chew by mouth.        Allergies:  No Known Allergies  Family History: Family History  Problem Relation Age of Onset   Prostate cancer Brother    Heart disease Mother    Bladder Cancer Neg Hx    Kidney cancer Neg Hx     Social History:   reports that he has never smoked. He has never been exposed to tobacco smoke. He has never used smokeless tobacco. He reports that he does not drink alcohol and does not use drugs.  Physical Exam: BP (!) 145/73   Pulse 81   Constitutional:  Alert, no acute distress, nontoxic appearing HEENT: [place], AT Cardiovascular: No clubbing, cyanosis, or edema Respiratory: Normal respiratory effort, no increased work of breathing GU: Distended bladder visible below the umbilicus Skin: No rashes, bruises or suspicious lesions Neurologic: Grossly intact, no focal deficits, moving  all 4 extremities Psychiatric: Normal mood and affect  Laboratory Data: Results for orders placed or performed in visit on 02/12/23  Bladder Scan (Post Void Residual) in office  Result Value Ref Range   Scan Result >915ml    Simple Catheter Placement  Due to urinary retention patient is present today for a foley cath placement.  Patient was cleaned and prepped in a sterile fashion with betadine and 2% lidocaine jelly was instilled into the urethra. A 16 FR coude foley catheter was inserted, urine return was noted  , urine was yellow in color.  The balloon was filled with 10cc of sterile water.  A leg bag was attached for drainage. Patient tolerated well, no complications were noted   Performed by: Carman Ching, PA-C   Assessment & Plan:   1. Acute urinary retention Recurrent urinary retention with significantly elevated PVR on arrival.  I offered him Foley catheter replacement and he accepted, see procedure note above.  Will plan for repeat voiding trial in 2 weeks.  We discussed that if he is not successful in voiding at his next voiding trial, options will include chronic Foley with or without finasteride versus consideration of outlet procedures.  However, given his frailty would only recommend pursuing outlet procedures if he poorly tolerates Foley.  Urine sample obtained from his Foley today for UA.  Will call him with results this afternoon and treat with antibiotics as indicated. - Bladder Scan (Post Void Residual) in office - Urinalysis, Complete   Return in about 2 weeks (around 02/26/2023) for Voiding trial.  Carman Ching, PA-C  [facility] [address] [place], [place] [address] [phone]

## 2023-02-12 NOTE — Addendum Note (Signed)
Addended by: Debarah Crape on: 02/12/2023 02:06 PM   Modules accepted: Orders

## 2023-02-12 NOTE — Telephone Encounter (Signed)
1st attempt to reach patient's wife, unable to leave a voice message

## 2023-02-12 NOTE — Telephone Encounter (Signed)
Can you please call Tyrone Keith and let her know that Tyrone Keith is urinalysis from today did appear infected.  I recommend starting antibiotics.  Please send in Omnicef 300 mg twice daily x 7 days to their preferred pharmacy.

## 2023-02-12 NOTE — Telephone Encounter (Signed)
Pt's wife was informed and verbalized understanding.

## 2023-02-17 LAB — CULTURE, URINE COMPREHENSIVE

## 2023-02-18 ENCOUNTER — Ambulatory Visit: Payer: Medicare Other | Admitting: Urology

## 2023-02-27 ENCOUNTER — Ambulatory Visit (INDEPENDENT_AMBULATORY_CARE_PROVIDER_SITE_OTHER): Payer: Medicare Other | Admitting: Urology

## 2023-02-27 ENCOUNTER — Ambulatory Visit: Payer: Medicare Other | Admitting: Physician Assistant

## 2023-02-27 ENCOUNTER — Encounter: Payer: Self-pay | Admitting: Urology

## 2023-02-27 DIAGNOSIS — R339 Retention of urine, unspecified: Secondary | ICD-10-CM | POA: Diagnosis not present

## 2023-02-27 DIAGNOSIS — N401 Enlarged prostate with lower urinary tract symptoms: Secondary | ICD-10-CM | POA: Diagnosis not present

## 2023-02-27 LAB — BLADDER SCAN AMB NON-IMAGING

## 2023-02-27 NOTE — Progress Notes (Signed)
Catheter Removal  Patient is present today for a catheter removal.  10ml of water was drained from the balloon. A 16FR foley cath was removed from the bladder, no complications were noted. Patient tolerated well.  Performed by: Randa Lynn, RMA  Follow up/ Additional notes: this afternoon with Carollee Herter

## 2023-02-27 NOTE — Progress Notes (Signed)
02/27/2023 4:36 PM   [name] [birth year] [id number]  Referring provider: Kandyce Rud, MD [phone] [address] [facility] [place],  [place] [address]  Urological history: 1. High risk hematuria -non- smoker -CTU (2020) - no worrisome findings -cysto (2020) - massive BPH  2. BPH with LU TS -cysto (2020) massive BPH -tamsulosin 0.4 mg daily and finasteride 5 mg daily  Chief Complaint  Patient presents with   pm voiding trial    HPI: Tyrone Keith is a 89 y.o. male who presents today for TOV with his wife, [name].    Previous records reviewed.   He has not urinated since his left the office this morning.  His PVR was 273 mL.  Patient denies any modifying or aggravating factors.  Patient denies any recent UTI's, gross hematuria, dysuria or suprapubic/flank pain.  Patient denies any fevers, chills, nausea or vomiting.    PMH: Past Medical History:  Diagnosis Date   Abnormal weight loss    Acute kidney failure (HCC)    Alzheimer's dementia (HCC)    Atrial fibrillation (HCC)    BPH (benign prostatic hyperplasia) 12/19/2014   CAD (coronary artery disease)    Dementia (HCC)    Elevated PSA    Epididymitis    Graves disease    Graves disease    Graves disease    Hypertension    Nocturia 12/19/2014   Postoperative primary hypothyroidism     Surgical History: Past Surgical History:  Procedure Laterality Date   EYE SURGERY     TOTAL THYROIDECTOMY      Home Medications:  Allergies as of 02/27/2023   No Known Allergies      Medication List        Accurate as of February 27, 2023  4:36 PM. If you have any questions, ask your nurse or doctor.          aspirin EC 81 MG tablet Take 1 tablet (81 mg total) by mouth daily. Swallow whole.   atorvastatin 20 MG tablet Commonly known as: LIPITOR TAKE 1 TABLET(20 MG) BY MOUTH DAILY   busPIRone 15 MG tablet Commonly known as: BUSPAR Take by mouth.   cefdinir 300 MG  capsule Commonly known as: OMNICEF Take 1 capsule (300 mg total) by mouth 2 (two) times daily.   donepezil 10 MG tablet Commonly known as: ARICEPT Take 1 tablet by mouth at bedtime.   feeding supplement Liqd Take 237 mLs by mouth 3 (three) times daily.   finasteride 5 MG tablet Commonly known as: PROSCAR Take 1 tablet (5 mg total) by mouth daily.   levothyroxine 100 MCG tablet Commonly known as: SYNTHROID Take by mouth.   mirtazapine 15 MG tablet Commonly known as: REMERON Take 1 tablet by mouth at bedtime.   tamsulosin 0.4 MG Caps capsule Commonly known as: FLOMAX Take 1 capsule (0.4 mg total) by mouth daily.   Vitamin D3 10 MCG (400 UNIT) Chew Chew by mouth.        Allergies: No Known Allergies  Family History: Family History  Problem Relation Age of Onset   Prostate cancer Brother    Heart disease Mother    Bladder Cancer Neg Hx    Kidney cancer Neg Hx     Social History:  reports that he has never smoked. He has never been exposed to tobacco smoke. He has never used smokeless tobacco. He reports that he does not drink alcohol and does not use drugs.  ROS: Pertinent ROS in HPI  Physical Exam: There were no vitals taken for this visit.  Constitutional:  Well nourished. Alert and oriented, No acute distress. HEENT: [place] AT, moist mucus membranes.  Trachea midline, no masses. Cardiovascular: No clubbing, cyanosis, or edema. Respiratory: Normal respiratory effort, no increased work of breathing. GI: Abdomen is soft, non tender, non distended, no abdominal masses. Liver and spleen not palpable.  No hernias appreciated.  Stool sample for occult testing is not indicated.   GU: No CVA tenderness.  No bladder fullness or masses.  Patient with uncircumcised phallus.   Foreskin easily retracted.  Urethral meatus is patent.  No penile discharge. No penile lesions or rashes. Scrotum without lesions, cysts, rashes and/or edema.   Psychiatric: Normal mood and  affect.  Laboratory Data: Lab Results  Component Value Date   WBC 6.4 01/25/2023   HGB 13.8 01/25/2023   HCT 42.8 01/25/2023   MCV 97.7 01/25/2023   PLT 145 (L) 01/25/2023    Lab Results  Component Value Date   CREATININE 1.46 (H) 01/25/2023    Lab Results  Component Value Date   AST 24 01/25/2023   Lab Results  Component Value Date   ALT 14 01/25/2023  I have reviewed the labs.   Pertinent Imaging:  02/27/23 15:18  Scan Result    In and Out Catheterization Patient's wifecleaned and prepped penis in a sterile fashion with betadine . A 16 FR Coloplast Coude cath was inserted no complications were noted , 280 ml of urine return was noted, urine was dark yellow in color.  Bladder was drained  And catheter was removed with out difficulty.    Wife feels comfortable with cathing her husband.    Assessment & Plan:    1. BPH with retention - Bladder Scan (Post Void Residual) in office -wife feels comfortable with self cathing, so she will cath him twice daily and when he is uncomfortable -they will return in one week so we can discuss how things are going with the cathing and review residuals - I have given them a box of samples, I have not sent in an order yet in case he becomes intolerable self cathing, the wife has difficulty and does not feel comfortable continuing with cathing or were if he somehow spontaneously starts voiding on his own   Return in about 1 week (around 03/06/2023) for recheck .  These notes generated with voice recognition software. I apologize for typographical errors.  Cloretta Ned  [facility] [address]  [address] [place], [place] [address] [phone]

## 2023-02-28 ENCOUNTER — Telehealth: Payer: Self-pay

## 2023-02-28 NOTE — Telephone Encounter (Signed)
Pt's wife calls and questions if it would be best to perform CIC while pt is sitting or standing. Advised wife on preference of sitting or standing, she voiced understaning. Will also mail CIC information sheet.      Step 1 Get all of your supplies ready and place them near you. Step 2 Wash your hands with warm, soapy water or put on gloves. Step 3 Wash around the tip of your penis with warm, soapy water or a castille soap towelette. Step 4 Take catheter out of package and drain the lubricant over toilet. Step 5 Hold the penis at a 45 degree angle from the stomach in one hand and the catheter in the other hand. Step 6 Insert the catheter slowly into your urethra. If there is resistance when the catheter reaches the sphincter muscle,              take a deep breath and gently apply steady pressure.              DO NOT FORCE THE CATHETER Step 7 When the urine begins to flow, insert another inch and lower penis. Allow the urine to flow into the toilet. Step 8 When the flow of urine stops, slowly remove the catheter.

## 2023-03-03 ENCOUNTER — Ambulatory Visit: Payer: Medicare Other | Admitting: Physician Assistant

## 2023-03-03 VITALS — BP 129/63 | Temp 97.5°F | Ht 70.0 in | Wt 145.0 lb

## 2023-03-03 DIAGNOSIS — R310 Gross hematuria: Secondary | ICD-10-CM | POA: Diagnosis not present

## 2023-03-03 LAB — BLADDER SCAN AMB NON-IMAGING: Scan Result: 648 ml

## 2023-03-03 MED ORDER — SULFAMETHOXAZOLE-TRIMETHOPRIM 800-160 MG PO TABS
1.0000 | ORAL_TABLET | Freq: Two times a day (BID) | ORAL | 0 refills | Status: AC
Start: 2023-03-03 — End: 2023-03-10

## 2023-03-03 NOTE — Progress Notes (Signed)
03/03/2023 5:00 PM   Tyrone Keith [birth year] [id number]  CC: Chief Complaint  Patient presents with   Follow-up    blood clots when self cath    HPI: Tyrone Keith is a 89 y.o. male with PMH BPH with prostatomegaly and urinary retention who started CIC by his wife 4 days ago on Flomax and finasteride, Alzheimer's dementia, and gross hematuria with benign workup in 2020 who presents today for evaluation of gross hematuria.  He is accompanied today by his wife, [name], who contributes to HPI.  Today [name] reports that she attempted to cath him earlier today with passage of bright red urine with clots.  She describes some of the urine as ketchupy in consistency.  Bladder scan on arrival 648 mL.  Patient denies pain or fevers.  PMH: Past Medical History:  Diagnosis Date   Abnormal weight loss    Acute kidney failure (HCC)    Alzheimer's dementia (HCC)    Atrial fibrillation (HCC)    BPH (benign prostatic hyperplasia) 12/19/2014   CAD (coronary artery disease)    Dementia (HCC)    Elevated PSA    Epididymitis    Graves disease    Graves disease    Graves disease    Hypertension    Nocturia 12/19/2014   Postoperative primary hypothyroidism     Surgical History: Past Surgical History:  Procedure Laterality Date   EYE SURGERY     TOTAL THYROIDECTOMY      Home Medications:  Allergies as of 03/03/2023   No Known Allergies      Medication List        Accurate as of March 03, 2023  5:00 PM. If you have any questions, ask your nurse or doctor.          aspirin EC 81 MG tablet Take 1 tablet (81 mg total) by mouth daily. Swallow whole.   atorvastatin 20 MG tablet Commonly known as: LIPITOR TAKE 1 TABLET(20 MG) BY MOUTH DAILY   busPIRone 15 MG tablet Commonly known as: BUSPAR Take by mouth.   cefdinir 300 MG capsule Commonly known as: OMNICEF Take 1 capsule (300 mg total) by mouth 2 (two) times daily.   donepezil 10 MG tablet Commonly known as:  ARICEPT Take 1 tablet by mouth at bedtime.   feeding supplement Liqd Take 237 mLs by mouth 3 (three) times daily.   finasteride 5 MG tablet Commonly known as: PROSCAR Take 1 tablet (5 mg total) by mouth daily.   gabapentin 100 MG capsule Commonly known as: NEURONTIN Take by mouth.   levothyroxine 100 MCG tablet Commonly known as: SYNTHROID Take by mouth.   mirtazapine 15 MG tablet Commonly known as: REMERON Take 1 tablet by mouth at bedtime.   sulfamethoxazole-trimethoprim 800-160 MG tablet Commonly known as: BACTRIM DS Take 1 tablet by mouth 2 (two) times daily for 7 days.   tamsulosin 0.4 MG Caps capsule Commonly known as: FLOMAX Take 1 capsule (0.4 mg total) by mouth daily.   Vitamin D3 10 MCG (400 UNIT) Chew Chew by mouth.        Allergies:  No Known Allergies  Family History: Family History  Problem Relation Age of Onset   Prostate cancer Brother    Heart disease Mother    Bladder Cancer Neg Hx    Kidney cancer Neg Hx     Social History:   reports that he has never smoked. He has never been exposed to tobacco smoke. He has never used smokeless tobacco.  He reports that he does not drink alcohol and does not use drugs.  Physical Exam: BP 129/63   Temp (!) 97.5 F (36.4 C)   Ht 5\' 10"  (1.778 m)   Wt 145 lb (65.8 kg)   BMI 20.81 kg/m   Constitutional:  Alert, no acute distress, nontoxic appearing HEENT: [name], AT Cardiovascular: No clubbing, cyanosis, or edema Respiratory: Normal respiratory effort, no increased work of breathing Skin: No rashes, bruises or suspicious lesions Neurologic: Grossly intact, no focal deficits, moving all 4 extremities Psychiatric: Normal mood and affect  Laboratory Data: Results for orders placed or performed in visit on 03/03/23  BLADDER SCAN AMB NON-IMAGING  Result Value Ref Range   Scan Result 648 ml   Simple Catheter Placement, bladder irrigation, catheter replacement, bladder irrigation #2:  Due to gross  hematuria and concern for clot retention patient is present today for a foley cath placement.  Patient was cleaned and prepped in a sterile fashion with betadine and 2% lidocaine jelly was instilled into the urethra. A 24 FR three way foley catheter was inserted, urine return was noted  , urine was brown in color.    180 mL of sterile water was instilled into the bladder with a 70mL [place]omey syringe through the catheter in place.  of urine return was cleared from the bladder with evacuation of 0ccs of clot material. Efflux remained light red with the procedure and the catheter irrigated easily. Catheter was then removed in its entirety.  Next, patient was cleaned and re-prepped in a sterile fashion with betadine and 2% lidocaine jelly was instilled into the urethra. An 18Fr coude Foley catheter was inserted, urine return was noted 10mL, urine was red in color. The balloon was inflated to 30ccs and left on tension to tamponade the prostate. A urine sample was obtained for culture.  of sterile water was then instilled into the bladder with a 70mL Toomey syringe through the catheter in place. of urine return was cleared from the bladder with evacuation of 5ccs of clot material. Efflux cleared from dark red to red with the procedure and the catheter irrigated easily. Catheter was attached to a leg bag for drainage and left on tension.  Performed by: Carman Ching, PA-C and Randa Lynn, CMA  Assessment & Plan:   1. Gross hematuria Gross hematuria earlier today after CIC, presumed prostatic bleeding in the setting of known prostatomegaly, having only recently resumed finasteride.  PVR was significantly elevated on arrival, concerning for clot retention.  I elected to place a hematuria catheter and manually irrigate his bladder in clinic as above.  Fortunately, I drained a significant volume of runny urine without clots and was unable to clear any significant clot  burden from his bladder.  At this point, however his hematuria started to worsen consistent with exacerbated prostatic bleeding and/or rapid bladder decompression.  I felt that it would be most appropriate to keep a Foley catheter in place due to concerns for worsened bleeding with further CIC attempts at home.  However, I felt he would not will tolerate a 24 [place] three-way hematuria catheter and elected to downsize his catheter.  I subsequently downsized his catheter to an 63 [place] coud and manually irrigated him again, see above.  I hyperinflated his balloon to 30 cc to tamponade the prostate.  He continued to have gross hematuria, however his catheter continued to drain well with no evidence of significant clot burden.   Overall given his clinical presentation today,  I suspect he is having a combination of prostatic bleeding and bleeding due to rapid bladder decompression as achieved in clinic.  No evidence of clot retention at this time.  Will plan to keep Foley catheter in place with follow-up in clinic next week to discuss keeping for chronic Foley versus resuming CIC.  I encouraged the patient to stay well-hydrated to dilute his urine and gave him the goal of 24 ounces of fluid before he goes to bed tonight.  We also discussed return precautions including nondraining Foley catheter.  Additionally, I am treating him with empiric Bactrim given degree of instrumentation today and self-catheterization at home to reduce his risk for infection. - CULTURE, URINE COMPREHENSIVE - sulfamethoxazole-trimethoprim (BACTRIM DS) 800-160 MG tablet; Take 1 tablet by mouth 2 (two) times daily for 7 days.  Dispense: 14 tablet; Refill: 0 - BLADDER SCAN AMB NON-IMAGING   Return in about 1 week (around 03/10/2023) for Retention/hematuria follow-up.  Carman Ching, PA-C  [facility] [place] [address], [address] [place], [place] [address] [phone]

## 2023-03-04 ENCOUNTER — Emergency Department
Admission: EM | Admit: 2023-03-04 | Discharge: 2023-03-04 | Disposition: A | Discharge status: Home / Self Care | Payer: Medicare Other | Attending: Emergency Medicine | Admitting: Emergency Medicine

## 2023-03-04 ENCOUNTER — Other Ambulatory Visit: Payer: Self-pay

## 2023-03-04 DIAGNOSIS — R319 Hematuria, unspecified: Secondary | ICD-10-CM | POA: Insufficient documentation

## 2023-03-04 DIAGNOSIS — R338 Other retention of urine: Secondary | ICD-10-CM

## 2023-03-04 DIAGNOSIS — R339 Retention of urine, unspecified: Secondary | ICD-10-CM | POA: Insufficient documentation

## 2023-03-04 DIAGNOSIS — D649 Anemia, unspecified: Secondary | ICD-10-CM | POA: Diagnosis not present

## 2023-03-04 LAB — COMPREHENSIVE METABOLIC PANEL
ALT: 12 U/L (ref 0–44)
AST: 21 U/L (ref 15–41)
Albumin: 3.6 g/dL (ref 3.5–5.0)
Alkaline Phosphatase: 53 U/L (ref 38–126)
Anion gap: 6 (ref 5–15)
BUN: 35 mg/dL — ABNORMAL HIGH (ref 8–23)
CO2: 26 mmol/L (ref 22–32)
Calcium: 8.5 mg/dL — ABNORMAL LOW (ref 8.9–10.3)
Chloride: 109 mmol/L (ref 98–111)
Creatinine, Ser: 2.16 mg/dL — ABNORMAL HIGH (ref 0.61–1.24)
GFR, Estimated: 29 mL/min — ABNORMAL LOW (ref >60)
Glucose, Bld: 104 mg/dL — ABNORMAL HIGH (ref 70–99)
Potassium: 4.4 mmol/L (ref 3.5–5.1)
Sodium: 141 mmol/L (ref 135–145)
Total Bilirubin: 0.5 mg/dL (ref 0.3–1.2)
Total Protein: 6.9 g/dL (ref 6.5–8.1)

## 2023-03-04 LAB — CBC WITH DIFFERENTIAL/PLATELET
Abs Immature Granulocytes: 0 10*3/uL (ref 0.00–0.07)
Basophils Absolute: 0.1 10*3/uL (ref 0.0–0.1)
Basophils Relative: 1 %
Eosinophils Absolute: 0.3 10*3/uL (ref 0.0–0.5)
Eosinophils Relative: 5 %
HCT: 35 % — ABNORMAL LOW (ref 39.0–52.0)
Hemoglobin: 11.3 g/dL — ABNORMAL LOW (ref 13.0–17.0)
Immature Granulocytes: 0 %
Lymphocytes Relative: 17 %
Lymphs Abs: 1 10*3/uL (ref 0.7–4.0)
MCH: 32.1 pg (ref 26.0–34.0)
MCHC: 32.3 g/dL (ref 30.0–36.0)
MCV: 99.4 fL (ref 80.0–100.0)
Monocytes Absolute: 0.7 10*3/uL (ref 0.1–1.0)
Monocytes Relative: 12 %
Neutro Abs: 3.6 10*3/uL (ref 1.7–7.7)
Neutrophils Relative %: 65 %
Platelets: 164 10*3/uL (ref 150–400)
RBC: 3.52 MIL/uL — ABNORMAL LOW (ref 4.22–5.81)
RDW: 15 % (ref 11.5–15.5)
WBC: 5.5 10*3/uL (ref 4.0–10.5)
nRBC: 0 % (ref 0.0–0.2)

## 2023-03-04 NOTE — ED Notes (Signed)
Bladder scan showed 70ml of urine, pts leg bag emptied and pt had output. MD made aware.

## 2023-03-04 NOTE — ED Triage Notes (Signed)
Pt presents ambulatory to triage via POV with complaints of a foley catheter issue. The foley was placed today due to an enlarged prostate and has had leaking around the tip of his penis. Per spouse the patient has som increased bleeding than when he left. He notes having some hematuria prior to leaving the office yesterday but has passed more clots. A&Ox4 at this time. Denies CP or SOB.

## 2023-03-04 NOTE — Discharge Instructions (Signed)
You were seen in the ER today for evaluation with your catheter problem.  I am glad that it is now functioning appropriately.  Please follow-up closely with urology for further evaluation.  Return to the ER for new or worsening symptoms including clogged or dislodged catheter, worsening abdominal pain, or any other new or concerning symptoms.

## 2023-03-04 NOTE — ED Provider Notes (Signed)
[facility] Provider Note    Event Date/Time   First MD Initiated Contact with Patient 03/04/23 0406     (approximate)   History   Foley Catheter Issue   HPI  Tyrone Keith is a 89 y.o. male with history of BPH complicated by urinary retention presenting to the emergency department for evaluation of Foley catheter problem.  Collateral history obtained from patient's wife.  She reports about a month ago, patient had an episode of urinary retention that was felt to be related to his enlarged prostate.  He had multiple void trials which he failed.  4 days ago patient was started on continuous intermittent cath that his wife was performing.  Unfortunately, she was unable to cath him on Monday, so she was seen in the urology office.  There, patient was thought to have clot retention so he had a Foley catheter placed that was irrigated.  There was some hematuria, but ultimately an 43 French catheter was placed with drainage of clearing urine.  Unfortunately, since they have returned home, patient's wife has noticed that he had increased passage of clots followed by leakage of fluid at his penis around his catheter.     Physical Exam   Triage Vital Signs: ED Triage Vitals [03/04/23 0141]  Encounter Vitals Group     BP (!) 136/52     Systolic BP Percentile      Diastolic BP Percentile      Pulse Rate 81     Resp 18     Temp 97.6 F (36.4 C)     Temp Source Oral     SpO2 100 %     Weight      Height      Head Circumference      Peak Flow      Pain Score 0     Pain Loc      Pain Education      Exclude from Growth Chart     Most recent vital signs: Vitals:   03/04/23 0141 03/04/23 0655  BP: (!) 136/52 133/61  Pulse: 81 78  Resp: 18 16  Temp: 97.6 F (36.4 C)   SpO2: 100% 100%     General: Awake, interactive  CV:  Regular rate, good peripheral perfusion.  Resp:  Lungs clear, unlabored respirations.  Abd:  Soft, nondistended.  GU:  Foley catheter  in place with bloody drainage in leg bag.  No active drainage noted.  There is leakage of urine at the urethral meatus with around the Foley catheter.  The catheter was flushed after which blood-tinged urine was noted to drain into the leg bag. Neuro:  Symmetric facial movement, fluid speech   ED Results / Procedures / Treatments   Labs (all labs ordered are listed, but only abnormal results are displayed) Labs Reviewed  CBC WITH DIFFERENTIAL/PLATELET - Abnormal; Notable for the following components:      Result Value   RBC 3.52 (*)    Hemoglobin 11.3 (*)    HCT 35.0 (*)    All other components within normal limits  COMPREHENSIVE METABOLIC PANEL - Abnormal; Notable for the following components:   Glucose, Bld 104 (*)    BUN 35 (*)    Creatinine, Ser 2.16 (*)    Calcium 8.5 (*)    GFR, Estimated 29 (*)    All other components within normal limits     EKG EKG independently reviewed interpreted by myself (ER attending) demonstrates:  RADIOLOGY Imaging independently reviewed and interpreted by myself demonstrates:    PROCEDURES:  Critical Care performed: No  Procedures   MEDICATIONS ORDERED IN ED: Medications - No data to display   IMPRESSION / MDM / ASSESSMENT AND PLAN / ED COURSE  I reviewed the triage vital signs and the nursing notes.  Differential diagnosis includes, but is not limited to, Foley catheter complication  Patient's presentation is most consistent with acute, uncomplicated illness.  89 year old male presenting to the emergency department for evaluation of Foley catheter complication.  On presentation here, it appears that his catheter was clogged with clot.  This was able to be flushed with improved drainage into his catheter bag.  He was monitored with continued drainage into his leg bag.  A bladder scan was performed with under 100 cc of urine in his bladder, no evidence of significant urinary retention.  He did have blood work sent from triage that  demonstrated a AKI with creatinine of 2.16.  Also notable for anemia with hemoglobin of 11.3, similar to patient's priors. Patient denies any complaints other than his Foley catheter related issues.  Suspect AKI secondary to obstruction which is now resolved.  With his close urology follow-up, do think discharge is reasonable.  Patient and family comfortable with discharge.  Strict return precautions provided.  Patient discharged in stable condition.     FINAL CLINICAL IMPRESSION(S) / ED DIAGNOSES   Final diagnoses:  Acute urinary retention  Hematuria, unspecified type     Rx / DC Orders   ED Discharge Orders     None        Note:  This document was prepared using Dragon voice recognition software and may include unintentional dictation errors.   Trinna Post, MD 03/04/23 [phone]

## 2023-03-04 NOTE — ED Notes (Signed)
Foley catheter flushed and urine returned with no issue. Catheter draining at this time. Pt denies comfort. MD aware.

## 2023-03-06 ENCOUNTER — Ambulatory Visit: Payer: Medicare Other | Admitting: Urology

## 2023-03-12 ENCOUNTER — Ambulatory Visit: Payer: Medicare Other | Admitting: Physician Assistant

## 2023-03-12 ENCOUNTER — Encounter: Payer: Self-pay | Admitting: Physician Assistant

## 2023-03-12 ENCOUNTER — Other Ambulatory Visit: Payer: Self-pay

## 2023-03-12 ENCOUNTER — Telehealth: Payer: Self-pay

## 2023-03-12 VITALS — BP 113/63 | HR 69 | Ht 70.0 in | Wt 145.0 lb

## 2023-03-12 DIAGNOSIS — R310 Gross hematuria: Secondary | ICD-10-CM

## 2023-03-12 DIAGNOSIS — N138 Other obstructive and reflux uropathy: Secondary | ICD-10-CM | POA: Diagnosis not present

## 2023-03-12 DIAGNOSIS — Z8042 Family history of malignant neoplasm of prostate: Secondary | ICD-10-CM

## 2023-03-12 DIAGNOSIS — N401 Enlarged prostate with lower urinary tract symptoms: Secondary | ICD-10-CM

## 2023-03-12 MED ORDER — AMOXICILLIN-POT CLAVULANATE 875-125 MG PO TABS
1.0000 | ORAL_TABLET | Freq: Two times a day (BID) | ORAL | 0 refills | Status: DC
Start: 2023-03-12 — End: 2023-03-12

## 2023-03-12 MED ORDER — AMOXICILLIN-POT CLAVULANATE 875-125 MG PO TABS
1.0000 | ORAL_TABLET | Freq: Two times a day (BID) | ORAL | 0 refills | Status: AC
Start: 2023-03-12 — End: 2023-03-19

## 2023-03-12 NOTE — Telephone Encounter (Signed)
I spoke with Tyrone Keith wife, Tyrone Keith. We have discussed possible surgery dates and Friday August 30th, 2024 was agreed upon by all parties. Patient given information about surgery date, what to expect pre-operatively and post operatively.  We discussed that a Pre-Admission Testing office will be calling to set up the pre-op visit that will take place prior to surgery, and that these appointments are typically done over the phone with a Pre-Admissions RN. Informed patient that our office will communicate any additional care to be provided after surgery. Patients questions or concerns were discussed during our call. Advised to call our office should there be any additional information, questions or concerns that arise. Patient verbalized understanding.

## 2023-03-12 NOTE — Progress Notes (Signed)
Surgical Physician Order Form [facility] [place]  Dr. Richardo Hanks * Scheduling expectation : Next Available  *Length of Case:   *Clearance needed: yes, cardiac  *Anticoagulation Instructions: N/A  *Aspirin Instructions: Hold Aspirin  *Post-op visit Date/Instructions:  1-3 day voiding trial  *Diagnosis: BPH w/BOO  *Procedure:  HOLEP ([address])   Additional orders: N/A  -Admit type: OUTpatient  -Anesthesia: General  -VTE Prophylaxis Standing Order SCD's       Other:   -Standing Lab Orders Per Anesthesia    Lab other: None  -Standing Test orders EKG/Chest x-ray per Anesthesia       Test other:   - Medications:  Cipro 400mg  IV  -Other orders:  N/A

## 2023-03-12 NOTE — Progress Notes (Signed)
  Phone Number: [phone] for Surgical Coordinator Fax Number: [phone]  REQUEST FOR SURGICAL CLEARANCE       Date: Date: 03/12/23  Faxed to: Dr. Okey Dupre, MD  Surgeon: Dr. Legrand Rams, MD     Date of Surgery: 04/04/2023  Operation: Holmium Laser Enucleation of the Prostate   Anesthesia Type: General   Diagnosis: Benign Prostatic Hyperplasia with Urinary Obstruction  Patient Requires:   Cardiac / Vascular Clearance : Yes  Reason: Would like for patient to hold 81mg  ASA prior to surgery   Risk Assessment:    Low   []       Moderate   []     High   []           This patient is optimized for surgery  YES []       NO   []    I recommend further assessment/workup prior to surgery. YES []      NO  []   Appointment scheduled for: _______________________   Further recommendations: ____________________________________     Physician Signature:__________________________________   Printed Name: ________________________________________   Date: _________________

## 2023-03-12 NOTE — Progress Notes (Signed)
03/12/2023 4:52 PM   Tyrone Keith 10-18-1932 [id number]  CC: Chief Complaint  Patient presents with   Follow-up   HPI: Tyrone Keith is a 89 y.o. male with PMH BPH with prostatomegaly and urinary retention on Flomax and finasteride, Alzheimer's dementia, and gross hematuria with benign workup in 2020 who presents today for follow-up after developing gross hematuria due to CIC late last month requiring Foley catheter placement.  He is accompanied today by his wife and daughter, who contribute to HPI.   Today he reports no acute urinary concerns.  He states he feels well and is not having any pain.  His gross hematuria cleared about 2 days after I last saw him in clinic on 03/03/2023.  Foley catheter is in place today draining clear, yellow urine.  Notably, his most recent urine culture from clinic with me grew pansensitive E faecalis.  He completed a week of empiric Bactrim about 2 days ago.  His wife and daughter report that they have noticed he has been manipulating his Foley catheter and they are very concerned that he may traumatically remove it due to his dementia.  They would like to pursue options to get him catheter free.  Additionally, they report that he has 7 siblings including 6 brothers, several of whom have been diagnosed with or died from prostate cancer between their late 72s and early 15s.  They think others have had BPH. Their only sister has had breast cancer.  Patient's daughter reports that she has a son and wonders if they should be concerned about a genetic component to this family history.  PMH: Past Medical History:  Diagnosis Date   Abnormal weight loss    Acute kidney failure (HCC)    Alzheimer's dementia (HCC)    Atrial fibrillation (HCC)    BPH (benign prostatic hyperplasia) 12/19/2014   CAD (coronary artery disease)    Dementia (HCC)    Elevated PSA    Epididymitis    Graves disease    Graves disease    Graves disease    Hypertension    Nocturia  12/19/2014   Postoperative primary hypothyroidism     Surgical History: Past Surgical History:  Procedure Laterality Date   EYE SURGERY     TOTAL THYROIDECTOMY      Home Medications:  Allergies as of 03/12/2023   No Known Allergies      Medication List        Accurate as of March 12, 2023  4:52 PM. If you have any questions, ask your nurse or doctor.          STOP taking these medications    cefdinir 300 MG capsule Commonly known as: OMNICEF Stopped by: Carman Ching       TAKE these medications    amoxicillin-clavulanate 875-125 MG tablet Commonly known as: AUGMENTIN Take 1 tablet by mouth 2 (two) times daily for 7 days. Started by: Carman Ching   aspirin EC 81 MG tablet Take 1 tablet (81 mg total) by mouth daily. Swallow whole.   atorvastatin 20 MG tablet Commonly known as: LIPITOR TAKE 1 TABLET(20 MG) BY MOUTH DAILY   busPIRone 15 MG tablet Commonly known as: BUSPAR Take by mouth.   donepezil 10 MG tablet Commonly known as: ARICEPT Take 1 tablet by mouth at bedtime.   feeding supplement Liqd Take 237 mLs by mouth 3 (three) times daily.   finasteride 5 MG tablet Commonly known as: PROSCAR Take 1 tablet (5 mg total) by mouth daily.  gabapentin 100 MG capsule Commonly known as: NEURONTIN Take by mouth.   levothyroxine 100 MCG tablet Commonly known as: SYNTHROID Take by mouth.   mirtazapine 15 MG tablet Commonly known as: REMERON Take 1 tablet by mouth at bedtime.   tamsulosin 0.4 MG Caps capsule Commonly known as: FLOMAX Take 1 capsule (0.4 mg total) by mouth daily.   Vitamin D3 10 MCG (400 UNIT) Chew Chew by mouth.        Allergies:  No Known Allergies  Family History: Family History  Problem Relation Age of Onset   Prostate cancer Brother    Heart disease Mother    Bladder Cancer Neg Hx    Kidney cancer Neg Hx     Social History:   reports that he has never smoked. He has never been exposed to  tobacco smoke. He has never used smokeless tobacco. He reports that he does not drink alcohol and does not use drugs.  Physical Exam: BP 113/63   Pulse 69   Ht 5\' 10"  (1.778 m)   Wt 145 lb (65.8 kg)   BMI 20.81 kg/m   Constitutional:  Alert, no acute distress, nontoxic appearing HEENT: [place], AT Cardiovascular: No clubbing, cyanosis, or edema Respiratory: Normal respiratory effort, no increased work of breathing Skin: No rashes, bruises or suspicious lesions Neurologic: Grossly intact, no focal deficits, moving all 4 extremities Psychiatric: Normal mood and affect  Assessment & Plan:   1. BPH with obstruction/lower urinary tract symptoms Discussed various treatment options for his urinary retention including HOLEP versus PAE versus chronic indwelling Foley catheter versus resuming CIC.  I agree that he is at risk for traumatic Foley removal with his dementia.  We discussed that HOLEP is the most likely option of these to adequately address his urinary retention and gross hematuria.  Family would like to proceed with HOLEP and patient is in agreement, booking sheet completed today.  Will treat with 1 week of Augmentin to sterilize the urine given E faecalis on recent culture. - Ambulatory Referral For Surgery Scheduling - amoxicillin-clavulanate (AUGMENTIN) 875-125 MG tablet; Take 1 tablet by mouth 2 (two) times daily for 7 days.  Dispense: 14 tablet; Refill: 0  2. Gross hematuria Anticipate secondary to BPH as above.  None currently.  We discussed that even if he is unable to void following HOLEP, I would anticipate they would be able to resume CIC with reduced bleeding risk.  Will keep him on finasteride for now.  3. Family history of prostate cancer We discussed that there are some genetic conditions that increase the likelihood for certain cancers including prostate and breast.  Typically we see these associated with earlier onset cancer diagnoses, however given the number of diagnoses  it seems there have been in his family, I do anticipate that I will recommend genetics referral.  I have asked his wife and daughter to clarify their family history and reach back out to me.  Return for Will call to schedule surgery.  Carman Ching, PA-C  [facility] [place] [address] [place], [place] [address] [phone]

## 2023-03-12 NOTE — Addendum Note (Signed)
Addended by: Letta Kocher A on: 03/12/2023 05:55 PM   Modules accepted: Orders

## 2023-03-12 NOTE — Patient Instructions (Addendum)
Please try to find out the following family history: -Which siblings had prostate cancer, age at diagnosis, whether they are living or deceased, and if they died FROM the prostate cancer -Which siblings had benign prostate enlargement that interfered with urination  Holmium Laser Enucleation of the Prostate (HoLEP)  HoLEP is a treatment for men with benign prostatic hyperplasia (BPH). The laser surgery removed blockages of urine flow, and is done without any incisions on the body.     What is HoLEP?  HoLEP is a type of laser surgery used to treat obstruction (blockage) of urine flow as a result of benign prostatic hyperplasia (BPH). In men with BPH, the prostate gland is not cancerous, but has become enlarged. An enlarged prostate can result in a number of urinary tract symptoms such as weak urinary stream, difficulty in starting urination, inability to urinate, frequent urination, or getting up at night to urinate.  HoLEP was developed in the 1990's as a more effective and less expensive surgical option for BPH, compared to other surgical options such as laser vaporization(PVP/greenlight laser), transurethral resection of the prostate(TURP), and open simple prostatectomy.   What happens during a HoLEP?  HoLEP requires general anesthesia ("asleep" throughout the procedure).   An antibiotic is given to reduce the risk of infection  A surgical instrument called a resectoscope is inserted through the urethra (the tube that carries urine from the bladder). The resectoscope has a camera that allows the surgeon to view the internal structure of the prostate gland, and to see where the incisions are being made during surgery.  The laser is inserted into the resectoscope and is used to enucleate (free up) the enlarged prostate tissue from the capsule (outer shell) and then to seal up any blood vessels. The tissue that has been removed is pushed back into the bladder.  A morcellator is placed through  the resectoscope, and is used to suction out the prostate tissue that has been pushed into the bladder.  When the prostate tissue has been removed, the resectoscope is removed, and a foley catheter is placed to allow healing and drain the urine from the bladder.     What happens after a HoLEP?  More than 90% of patients go home the same day a few hours after surgery. Less than 10% will be admitted to the hospital overnight for observation to monitor the urine, or if they have other medical problems.  Fluid is flushed through the catheter for about 1 hour after surgery to clear any blood from the urine. It is normal to have some blood in the urine after surgery. The need for blood transfusion is extremely rare.  Eating and drinking are permitted after the procedure once the patient has fully awakened from anesthesia.  The catheter is usually removed 2-3 days after surgery- the patient will come to clinic to have the catheter removed and make sure they can urinate on their own.  It is very important to drink lots of fluids after surgery for one week to keep the bladder flushed.  At first, there may be some burning with urination, but this typically improved within a few hours to days. Most patients do not have a significant amount of pain, and narcotic pain medications are rarely needed.  Symptoms of urinary frequency, urgency, and even leakage are NORMAL for the first few weeks after surgery as the bladder adjusts after having to work hard against blockage from the prostate for many years. This will improve, but can sometimes  take several months.  The use of pelvic floor exercises (Kegel exercises) can help improve problems with urinary incontinence.   After catheter removal, patients will be seen at 6 weeks and 6 months for symptom check  No heavy lifting for at least 2-3 weeks after surgery, however patients can walk and do light activities the first day after surgery. Return to work time  depends on occupation.    What are the advantages of HoLEP?  HoLEP has been studied in many different parts of the world and has been shown to be a safe and effective procedure. Although there are many types of BPH surgeries available, HoLEP offers a unique advantage in being able to remove a large amount of tissue without any incisions on the body, even in very large prostates, while decreasing the risk of bleeding and providing tissue for pathology (to look for cancer). This decreases the need for blood transfusions during surgery, minimizes hospital stay, and reduces the risk of needing repeat treatment.  What are the side effects of HoLEP?  Temporary burning and bleeding during urination. Some blood may be seen in the urine for weeks after surgery and is part of the healing process.  Urinary incontinence (inability to control urine flow) is expected in all patients immediately after surgery and they should wear pads for the first few days/weeks. This typically improves over the course of several weeks. Performing Kegel exercises can help decrease leakage from stress maneuvers such as coughing, sneezing, or lifting. The rate of long term leakage is very low. Patients may also have leakage with urgency and this may be treated with medication. The risk of urge incontinence can be dependent on several factors including age, prostate size, symptoms, and other medical problems.  Retrograde ejaculation or "backwards ejaculation." In 75% of cases, the patient will not see any fluid during ejaculation after surgery.  Erectile function is generally not significantly affected.   What are the risks of HoLEP?  Injury to the urethra or development of scar tissue at a later date  Injury to the capsule of the prostate (typically treated with longer catheterization).  Injury to the bladder or ureteral orifices (where the urine from the kidney drains out)  Infection of the bladder, testes, or kidneys   Return of urinary obstruction at a later date requiring another operation (<2%)  Need for blood transfusion or re-operation due to bleeding  Failure to relieve all symptoms and/or need for prolonged catheterization after surgery  5-15% of patients are found to have previously undiagnosed prostate cancer in their specimen. Prostate cancer can be treated after HoLEP.  Standard risks of anesthesia including blood clots, heart attacks, etc  When should I call my doctor?  Fever over 101.3 degrees  Inability to urinate, or large blood clots in the urine

## 2023-03-12 NOTE — Progress Notes (Signed)
   [facility] Posting Form  Surgery Date: Date: 04/04/2023  Surgeon: Dr. Legrand Rams, MD  Inpt ( No  )   Outpt (Yes)   Obs ( No  )   Diagnosis: N40.1, N13.8 Benign Prostatic Hyperplasia with Urinary Obstruction  -CPT: [phone]  Surgery: Holmium Laser Enucleation of the Prostate  Stop Anticoagulations: Yes, will need to hold ASA  Cardiac/Medical/Pulmonary Clearance needed: yes  Clearance needed from Dr: Okey Dupre with [facility]  Clearance request sent on: Date: 03/12/23  *Orders entered into EPIC  Date: 03/12/23   *Case booked in EPIC  Date: 03/12/23  *Notified pt of Surgery: Date: 03/12/23  PRE-OP UA & CX: no  *Placed into Prior Authorization Work [place] Date: 03/12/23  Assistant/laser/rep:No

## 2023-03-13 ENCOUNTER — Telehealth: Payer: Self-pay | Admitting: *Deleted

## 2023-03-13 NOTE — Telephone Encounter (Signed)
S/w DPR the pt's wife. In review of schedules to see where we can get the pt in the office for pre op clearance, I did not find an open appt in time for surgery date. I did state there is one appt I was looking at 03/28/23 @ 10:20, 7 day hold, though we are further than 7 days out at this time. I will have the [facility] sched team see where they can get the pt for pre op clearance.

## 2023-03-13 NOTE — Telephone Encounter (Signed)
   Pre-operative Risk Assessment    Patient Name: Tyrone Keith  DOB: 1932-10-31 MRN: [id number]      Request for Surgical Clearance    Procedure:   HOLMIUM LASER ENUCLEATION OF THE PROSTATE  Date of Surgery:  Clearance 04/04/23                                 Surgeon:  DR. Legrand Rams Surgeon's Group or Practice Name:  [facility] [place] Phone number:  [phone] Fax number:  [phone] ATTN: MELISSA HADLEY, CMA   Type of Clearance Requested:   - Medical ; ASA    Type of Anesthesia:  General    Additional requests/questions:    [name]   03/13/2023, 8:43 AM

## 2023-03-13 NOTE — Telephone Encounter (Signed)
   Name: Tyrone Keith  DOB: 10/25/1932  MRN: [id number]  Primary Cardiologist: Yvonne Kendall, MD  Chart reviewed as part of pre-operative protocol coverage. Because of Tyrone Keith's past medical history and time since last visit, he will require a follow-up in-office visit in order to better assess preoperative cardiovascular risk. Has not been seen since 12/06/2021.   Pre-op covering staff: - Please schedule appointment and call patient to inform them. If patient already had an upcoming appointment within acceptable timeframe, please add "pre-op clearance" to the appointment notes so provider is aware. - Please contact requesting surgeon's office via preferred method (i.e, phone, fax) to inform them of need for appointment prior to surgery.  This message will also be routed to pharmacy pool and/or Dr End for input on holding ASA as requested below so that this information is available to the clearing provider at time of patient's appointment.   Joni Reining, NP  03/13/2023, 12:32 PM

## 2023-03-20 ENCOUNTER — Ambulatory Visit (INDEPENDENT_AMBULATORY_CARE_PROVIDER_SITE_OTHER): Payer: Medicare Other | Admitting: Physician Assistant

## 2023-03-20 DIAGNOSIS — T83091A Other mechanical complication of indwelling urethral catheter, initial encounter: Secondary | ICD-10-CM

## 2023-03-20 DIAGNOSIS — R339 Retention of urine, unspecified: Secondary | ICD-10-CM | POA: Diagnosis not present

## 2023-03-20 DIAGNOSIS — N401 Enlarged prostate with lower urinary tract symptoms: Secondary | ICD-10-CM

## 2023-03-20 MED ORDER — AMOXICILLIN 875 MG PO TABS
875.0000 mg | ORAL_TABLET | Freq: Two times a day (BID) | ORAL | 0 refills | Status: AC
Start: 2023-03-20 — End: 2023-04-03

## 2023-03-20 NOTE — Progress Notes (Signed)
Cath Change/ Replacement  Patient is present today for a catheter change due to urinary retention.  30ml of water was removed from the balloon, a 18FR coude foley cath was removed without difficulty.  Patient was cleaned and prepped in a sterile fashion with betadine and 2% lidocaine jelly was instilled into the urethra. A 18 FR coude foley cath was replaced into the bladder, no complications were noted. Urine return was noted 15ml and urine was yellow in color. The balloon was filled with 10ml of sterile water. A leg bag was attached for drainage.  Patient tolerated well.    Performed by: Carman Ching, PA-C   Additional notes: Patient presented to clinic today with his wife, because he thought they had an appointment.  While they were here, they reported that he has been having some urinary leakage around the catheter tubing.  On physical exam, Foley catheter is in place draining yellow-brown urine into the leg bag.  No active leakage noted.  His wife admits that he has been fiddling with the catheter and she is unsure if this is where the leakage is coming from.  I did note some brown urinary sediment at the drainage port on the leg bag, so I elected to exchange his Foley catheter in clinic today as above.  Notably, there was some soft brown sediment along the catheter tip with removal, suspect oxidized blood product versus possible stool which would be concerning for fistula.  I made Dr. Richardo Hanks aware.  Dr. Richardo Hanks, will start 2 weeks of amoxicillin 875mg  BID to sterilize the urine in advance of surgery.  Follow up: Return in 15 days (on 04/04/2023) for HOLEP with Dr. Richardo Hanks.

## 2023-03-26 ENCOUNTER — Encounter
Admission: RE | Admit: 2023-03-26 | Discharge: 2023-03-26 | Disposition: A | Discharge status: Home / Self Care | Payer: Medicare Other | Source: Ambulatory Visit | Attending: Urology | Admitting: Urology

## 2023-03-26 ENCOUNTER — Encounter: Payer: Self-pay | Admitting: Urology

## 2023-03-26 VITALS — Ht 70.0 in | Wt 145.5 lb

## 2023-03-26 DIAGNOSIS — Z01810 Encounter for preprocedural cardiovascular examination: Secondary | ICD-10-CM

## 2023-03-26 DIAGNOSIS — I480 Paroxysmal atrial fibrillation: Secondary | ICD-10-CM

## 2023-03-26 DIAGNOSIS — I10 Essential (primary) hypertension: Secondary | ICD-10-CM

## 2023-03-26 DIAGNOSIS — I25118 Atherosclerotic heart disease of native coronary artery with other forms of angina pectoris: Secondary | ICD-10-CM

## 2023-03-26 HISTORY — DX: Complete loss of teeth, unspecified cause, unspecified class: K08.109

## 2023-03-26 HISTORY — DX: Bradycardia, unspecified: R00.1

## 2023-03-26 HISTORY — DX: Gross hematuria: R31.0

## 2023-03-26 HISTORY — DX: Unspecified cataract: H26.9

## 2023-03-26 HISTORY — DX: Other specified diseases of pericardium: I31.8

## 2023-03-26 HISTORY — DX: Tremor, unspecified: R25.1

## 2023-03-26 HISTORY — DX: Hyperlipidemia, unspecified: E78.5

## 2023-03-26 HISTORY — DX: Pneumonia, unspecified organism: J18.9

## 2023-03-26 HISTORY — DX: Unspecified severe protein-calorie malnutrition: E43

## 2023-03-26 HISTORY — DX: Unspecified hearing loss, unspecified ear: H91.90

## 2023-03-26 HISTORY — DX: Male erectile dysfunction, unspecified: N52.9

## 2023-03-26 HISTORY — DX: Benign prostatic hyperplasia without lower urinary tract symptoms: N40.0

## 2023-03-26 HISTORY — DX: Personal history of tuberculosis: Z86.11

## 2023-03-26 HISTORY — DX: Other pancytopenia: D61.818

## 2023-03-26 NOTE — Patient Instructions (Signed)
Your procedure is scheduled on:04-04-23 Friday Report to the Registration Desk on the 1st floor of the [facility].Then proceed to the 2nd floor Surgery Desk To find out your arrival time, please call [phone] between 1PM - 3PM on:04-03-23 Thursday If your arrival time is 6:00 am, do not arrive before that time as the Medical [facility] entrance doors do not open until 6:00 am.  REMEMBER: Instructions that are not followed completely may result in serious medical risk, up to and including death; or upon the discretion of your surgeon and anesthesiologist your surgery may need to be rescheduled.  Do not eat food OR drink any liquids after midnight the night before surgery.  No gum chewing or hard candies.  One week prior to surgery:Last dose 03-26-23 Stop Anti-inflammatories (NSAIDS) such as Advil, Aleve, Ibuprofen, Motrin, Naproxen, Naprosyn and Aspirin based products such as Excedrin, Goody's Powder, BC Powder.You may however, take Tylenol if needed for pain up until the day of surgery. Stop ANY OVER THE COUNTER supplements/vitamins NOW (03-26-23) until after surgery (Vitamin D3)   Continue taking all prescribed medications  TAKE ONLY THESE MEDICATIONS THE MORNING OF SURGERY WITH A SIP OF WATER: -atorvastatin (LIPITOR)  -busPIRone (BUSPAR)  -finasteride (PROSCAR)  -gabapentin (NEURONTIN)  -levothyroxine (SYNTHROID)  -tamsulosin [facility])   Ask Dr End at your preop visit on 03-28-23 @ 1020 when you need to stop your 81 mg Aspirin  No Alcohol for 24 hours before or after surgery.  No Smoking including e-cigarettes for 24 hours before surgery.  No chewable tobacco products for at least 6 hours before surgery.  No nicotine patches on the day of surgery.  Do not use any "recreational" drugs for at least a week (preferably 2 weeks) before your surgery.  Please be advised that the combination of cocaine and anesthesia may have negative outcomes, up to and including death. If you test  positive for cocaine, your surgery will be cancelled.  On the morning of surgery brush your teeth with toothpaste and water, you may rinse your mouth with mouthwash if you wish. Do not swallow any toothpaste or mouthwash.  Do not wear jewelry, make-up, hairpins, clips or nail polish.  Do not wear lotions, powders, or perfumes.   Do not shave body hair from the neck down 48 hours before surgery.  Contact lenses, hearing aids and dentures may not be worn into surgery.  Do not bring valuables to the hospital. [facility] is not responsible for any missing/lost belongings or valuables.   Notify your doctor if there is any change in your medical condition (cold, fever, infection).  Wear comfortable clothing (specific to your surgery type) to the hospital.  After surgery, you can help prevent lung complications by doing breathing exercises.  Take deep breaths and cough every 1-2 hours. Your doctor may order a device called an Incentive Spirometer to help you take deep breaths. When coughing or sneezing, hold a pillow firmly against your incision with both hands. This is called "splinting." Doing this helps protect your incision. It also decreases belly discomfort.  If you are being admitted to the hospital overnight, leave your suitcase in the car. After surgery it may be brought to your room.  In case of increased patient census, it may be necessary for you, the patient, to continue your postoperative care in the Same Day Surgery department.  If you are being discharged the day of surgery, you will not be allowed to drive home. You will need a responsible individual to  drive you home and stay with you for 24 hours after surgery.   If you are taking public transportation, you will need to have a responsible individual with you.  Please call the Pre-admissions Testing Dept. at [phone] if you have any questions about these instructions.  Surgery Visitation Policy:  Patients having  surgery or a procedure may have two visitors.  Children under the age of 32 must have an adult with them who is not the patient.

## 2023-03-28 ENCOUNTER — Ambulatory Visit: Payer: Medicare Other | Attending: Internal Medicine | Admitting: Internal Medicine

## 2023-03-28 ENCOUNTER — Encounter: Payer: Self-pay | Admitting: Internal Medicine

## 2023-03-28 ENCOUNTER — Encounter
Admission: RE | Admit: 2023-03-28 | Discharge: 2023-03-28 | Disposition: A | Discharge status: Home / Self Care | Payer: Medicare Other | Source: Ambulatory Visit | Attending: Urology | Admitting: Urology

## 2023-03-28 VITALS — BP 126/52 | HR 55 | Ht 70.0 in | Wt 158.4 lb

## 2023-03-28 DIAGNOSIS — Z01810 Encounter for preprocedural cardiovascular examination: Secondary | ICD-10-CM | POA: Diagnosis not present

## 2023-03-28 DIAGNOSIS — I10 Essential (primary) hypertension: Secondary | ICD-10-CM

## 2023-03-28 DIAGNOSIS — Z01818 Encounter for other preprocedural examination: Secondary | ICD-10-CM | POA: Diagnosis present

## 2023-03-28 DIAGNOSIS — R9431 Abnormal electrocardiogram [ECG] [EKG]: Secondary | ICD-10-CM | POA: Insufficient documentation

## 2023-03-28 DIAGNOSIS — I480 Paroxysmal atrial fibrillation: Secondary | ICD-10-CM | POA: Insufficient documentation

## 2023-03-28 DIAGNOSIS — I25118 Atherosclerotic heart disease of native coronary artery with other forms of angina pectoris: Secondary | ICD-10-CM | POA: Diagnosis not present

## 2023-03-28 DIAGNOSIS — I2510 Atherosclerotic heart disease of native coronary artery without angina pectoris: Secondary | ICD-10-CM | POA: Diagnosis not present

## 2023-03-28 DIAGNOSIS — M7989 Other specified soft tissue disorders: Secondary | ICD-10-CM

## 2023-03-28 NOTE — Progress Notes (Unsigned)
Cardiology Office Note:  .   Date:  03/30/2023  ID:  Tyrone Keith, DOB 07-Jun-1933, MRN [id number] PCP: Kandyce Rud, MD  [facility] Providers Cardiologist:  Yvonne Kendall, MD     History of Present Illness: .   Tyrone Keith is a 89 y.o. male with history of single-vessel coronary artery disease involving the ostial LCx by coronary CTA, pericardial calcification (incidentally noted), isolated episode of atrial fibrillation in 2008, hypertension, Graves' disease with postoperative hypothyroidism, and BPH who presents for follow-up of coronary artery disease and atrial fibrillation.  He was last seen in our office in 12/2021 by Cadence Furth, PA, at which time he was feeling well.  He is scheduled for prostate surgery with Dr. Richardo Hanks next week due to urinary obstruction and gross hematuria.  Tyrone Keith presents with his family today and reports that he has been feeling fairly well.  He denies chest pain, shortness of breath, palpitations, and lightheadedness.  He denies leg swelling though his wife has noticed some in his feet.  She believes that Tyrone Keith may be forgetful of some of his medical history due to his Alzheimer's disease.  He has fallen twice in the last month but did not suffer any significant injuries.  He has a poor appetite and has been losing quite a bit of weight.  ROS: See HPI  Studies Reviewed: Marland Kitchen   EKG Interpretation Date/Time:  Friday March 28 2023 10:27:22 EDT Ventricular Rate:  55 PR Interval:  160 QRS Duration:  90 QT Interval:  438 QTC Calculation: 419 R Axis:   -54  Text Interpretation: Sinus bradycardia Left axis deviation Low voltage QRS Nonspecific T wave abnormality Abnormal ECG When compared with ECG of 06-Dec-2021 Premature ventricular complexes are no longer Present Confirmed by Anaeli Cornwall ([id number]) on 03/30/2023 1:56:41 PM    TTE (08/12/2019): Normal LV size and wall thickness.  LVEF 55-60% with grade 1 diastolic dysfunction.  Normal RV size  and function.  Normal biatrial size.  Mild mitral regurgitation.  Mild to moderate aortic regurgitation.  Elevated CVP.  Coronary CTA (05/27/2019): Mild plaquing of the proximal and mid LAD.  50% ostial LCx stenosis that is significant by CT FFR (0.8 in the proximal vessel and 0.75 in the mid vessel).  Risk Assessment/Calculations:    CHA2DS2-VASc Score = 4   This indicates a 4.8% annual risk of stroke. The patient's score is based upon: CHF History: 0 HTN History: 1 Diabetes History: 0 Stroke History: 0 Vascular Disease History: 1 Age Score: 2 Gender Score: 0         Physical Exam:   VS:  BP (!) 126/52 (BP Location: Left Arm, Patient Position: Sitting, Cuff Size: Normal)   Pulse (!) 55   Ht 5\' 10"  (1.778 m)   Wt 158 lb 6.4 oz (71.8 kg)   SpO2 100%   BMI 22.73 kg/m    Wt Readings from Last 3 Encounters:  03/28/23 158 lb 6.4 oz (71.8 kg)  03/26/23 145 lb 8.1 oz (66 kg)  03/12/23 145 lb (65.8 kg)    General:  NAD. Neck: No JVD or HJR. Lungs: Clear to auscultation bilaterally without wheezes or crackles. Heart: Bradycardic but regular without murmurs, rubs, or gallops. Abdomen: Soft, nontender, nondistended. Extremities: 1+ pretibial edema bilaterally  ASSESSMENT AND PLAN: .    Coronary artery disease: Tyrone Keith denies chest pain or other symptoms to suggest progressive of his coronary artery disease.  We will continue with aspirin and statin therapy in the  setting of his significant ostial LCx disease identified by prior coronary CTA.  No indication for cardiac catheterization given lack of symptoms.  Leg swelling: Likely multifactorial including some venous insufficiency.  Cannot exclude element of of diastolic heart failure.  We discussed conservative methods to help treat this, including leg elevation and compression stocking use.  We weighed the risks and benefits of repeating an echocardiogram and have agreed to defer this at the request of Tyrone Keith and his  wife.  Atrial fibrillation: Tyrone Keith has a history of isolated episode of atrial fibrillation several decades ago without any recurrence.  Anticoagulation has thus far been deferred, which I think is still appropriate given lack of recurrent atrial fibrillation as well as his ongoing urologic issues with gross hematuria and history of 2 falls in the last 1 to 2 months.  Hypertension: Blood pressure well-controlled today.  No medication changes at this time.  Preoperative cardiovascular risk assessment: Tyrone Keith is scheduled for urologic surgery next week for treatment of his bladder outlet obstruction and hematuria.  He does not have any symptoms of unstable cardiovascular disease.  His EKG is stable.  I think it is reasonable for him to proceed with his urologic surgery (low risk procedure) without additional cardiac testing or intervention.  Aspirin 81 mg daily can be held for 5 days before the procedure at the discretion of Dr. Richardo Hanks.  It should be resumed when safe from a postoperative standpoint if it is held.    Dispo: Return to clinic in 6 months.  Signed, Yvonne Kendall, MD

## 2023-03-28 NOTE — Patient Instructions (Signed)

## 2023-03-30 ENCOUNTER — Encounter: Payer: Self-pay | Admitting: Internal Medicine

## 2023-03-30 DIAGNOSIS — M7989 Other specified soft tissue disorders: Secondary | ICD-10-CM | POA: Insufficient documentation

## 2023-04-01 ENCOUNTER — Encounter: Payer: Self-pay | Admitting: Urology

## 2023-04-01 NOTE — Progress Notes (Signed)
Perioperative / Anesthesia Services  Pre-Admission Testing Clinical Review / Pre-Operative Anesthesia Consult  Date: 04/01/23  Patient Demographics:  Name: Tyrone Keith DOB:   12-11-1932 MRN:   [id number]  Planned Surgical Procedure(s):    Case: [id number] Date/Time: 04/04/23 0715   Procedure: HOLEP-LASER ENUCLEATION OF THE PROSTATE WITH MORCELLATION   Anesthesia type: General   Pre-op diagnosis: Benign Prostatic Hyperplasia with Urinary Obstruction   Location: [facility] OR ROOM 10 / [facility]   Surgeons: Sondra Come, MD     NOTE: Available PAT nursing documentation and vital signs have been reviewed. Clinical nursing staff has updated patient's PMH/PSHx, current medication list, and drug allergies/intolerances to ensure comprehensive history available to assist in medical decision making as it pertains to the aforementioned surgical procedure and anticipated anesthetic course. Extensive review of available clinical information personally performed. [facility] and PSHx updated with any diagnoses/procedures that  may have been inadvertently omitted during his intake with the pre-admission testing department's nursing staff.  Clinical Discussion:  Tyrone Keith is a 89 y.o. male who is submitted for pre-surgical anesthesia review and clearance prior to him undergoing the above procedure. Patient has never been a smoker. Pertinent PMH includes: CAD, atrial fibrillation, diastolic dysfunction, pericardial calcifications, sinus bradycardia, aortic atherosclerosis, peripheral edema, HTN, HLD, Basedow's/Graves' disease, postoperative hypothyroidism, diverticulosis, BPH, occasional tremors, pancytopenia, severe protein calorie malnutrition, falls, Alzheimer's dementia (on acetylcholinesterase inhibitor).  Patient is followed by cardiology (End, MD). He was last seen in the cardiology clinic on 03/28/2023; notes reviewed. At the time of his clinic visit, patient doing well  overall from a cardiovascular perspective.  Patient with some mild pedal edema.  Dementia seems to be worse per wife.  Patient had sustained 2 mechanical atraumatic falls. Patient denied any chest pain, shortness of breath, PND, orthopnea, palpitations, significant peripheral edema, weakness, fatigue, vertiginous symptoms, or presyncope/syncope. Patient with a past medical history significant for cardiovascular diagnoses. Documented physical exam was grossly benign, providing no evidence of acute exacerbation and/or decompensation of the patient's known cardiovascular conditions.  Cardiac CTA was performed on 05/27/2019 revealing an elevated coronary calcium score of 250.  This placed patient in the 52nd percentile for age/sex/race matched control.  Study revealed chronic pericardial calcification.  Results suggestive of  small areas of calcified plaque in the proximal and mid LAD and mild (less than 50%) stenosis of the proximal LCx.  FFR analysis revealed hemodynamically significant ostial and proximal LCx stenosis with an FFR reading of 0.8 (ranges: < 0.75 high likelihood of hemodynamically significant stenosis, 0.76-0.80 borderline, > 0.80 normal).  Most recent TTE was performed on 08/12/2019 revealing a normal left ventricular systolic function with an EF of 55%.  There was no LVH.  No regional wall motion abnormalities. Left ventricular diastolic Doppler parameters consistent with abnormal relaxation (G1DD).  Right ventricular size and function normal.  There was mild to moderate aortic valve sclerosis with associated mild to moderate regurgitation. All transvalvular gradients were noted to be normal providing no evidence suggestive of valvular stenosis. Aorta LV normal in size with no evidence of aneurysmal dilatation.  Patient with an atrial fibrillation diagnosis; CHA2DS2-VASc Score = 4 (age x 2, HTN, vascular disease history).cardiac rate and rhythm intrinsically maintained without the need for  pharmacological intervention.  Patient is not on chronic oral anticoagulation.  Blood pressure well-controlled at 126/52 mmHg again without the need for antihypertensives.  Patient is on atorvastatin for his HLD diagnosis and further ASCVD prevention.  He is not diabetic.  Patient does not have an OSAH diagnosis.  Functional capacity limited by patient's age, debility, frequent falls, and multiple medical comorbidities.  Given his progressive dementia, patient requires assistance with his ADLs.  With that said, patient felt to be able to still achieve at least 4 METS of physical activity without experiencing any angina/anginal equivalent symptoms.  No changes were made to his medication regimen.  Patient follow-up with outpatient cardiology in 6 months or sooner if needed.  [name] is scheduled for an HOLEP-LASER ENUCLEATION OF THE PROSTATE WITH MORCELLATION on 04/04/2023 with Dr. Legrand Rams, MD.  Given patient's past medical history significant for cardiovascular diagnoses, presurgical cardiac clearance was sought by the PAT team.  Per cardiology, "he does not have any symptoms of unstable cardiovascular disease. His EKG is stable. I think it is reasonable for him to proceed with his urologic surgery (low risk procedure) without additional cardiac testing or intervention".   In review of his medication reconciliation, it is noted that patient is currently on prescribed daily antithrombotic therapy. He has been instructed on recommendations for holding his low-dose ASA for 5 days prior to his procedure with plans to restart as soon as postoperative bleeding risk felt to be minimized by his attending surgeon. The patient has been instructed that his last dose of his ASA should be on 03/29/2023.  Patient denies previous perioperative complications with anesthesia in the past.  In review his EMR, there are no records available for review pertaining to past procedural/anesthetic courses within the [facility] system.     03/28/2023   10:22 AM 03/26/2023    9:00 AM 03/12/2023   10:17 AM  Vitals with BMI  Height 5\' 10"  5\' 10"  5\' 10"   Weight 158 lbs 6 oz 145 lbs 8 oz 145 lbs  BMI 22.73 20.88 20.81  Systolic 126  113  Diastolic 52  63  Pulse 55  69    Providers/Specialists:   NOTE: Primary physician provider listed below. Patient may have been seen by APP or partner within same practice.   PROVIDER ROLE / SPECIALTY LAST Lise Auer, MD Urology (Surgeon) 03/20/2023  Kandyce Rud, MD Primary Care Provider 02/14/2019  End, Cristal Deer, MD Cardiology 03/28/2023  Theora Master, MD Neurology 01/21/2023   Allergies:  Patient has no known allergies.  Current Home Medications:   No current facility-administered medications for this encounter.    amoxicillin (AMOXIL) 875 MG tablet   aspirin EC 81 MG tablet   atorvastatin (LIPITOR) 20 MG tablet   busPIRone (BUSPAR) 15 MG tablet   Cholecalciferol (VITAMIN D3) 10 MCG (400 UNIT) CHEW   donepezil (ARICEPT) 10 MG tablet   feeding supplement (ENSURE ENLIVE / ENSURE PLUS) LIQD   finasteride (PROSCAR) 5 MG tablet   gabapentin (NEURONTIN) 100 MG capsule   levothyroxine (SYNTHROID) 100 MCG tablet   mirtazapine (REMERON) 15 MG tablet   tamsulosin (FLOMAX) 0.4 MG CAPS capsule   History:   Past Medical History:  Diagnosis Date   Acute kidney failure (HCC)    Alzheimer's dementia (HCC)    a.) on acetylcholinesterase inhibitor (donepazil)   Aortic atherosclerosis (HCC)    Atrial fibrillation (HCC)    a.) CHA2DS2-VASc: 25 (age x2, HTN, vascular disease history); b.) rate/rhythm maintained intrinsically without pharmacological intervention; no chronic oral anticoagulation   Basedows disease (Graves disease)    Benign fibroma of prostate    Bilateral lower extremity edema    BPH (benign prostatic hyperplasia) 12/19/2014   CAD (coronary artery  disease) 05/27/2019   a.) cCTA 05/27/2019: Ca2+ 250 (52nd %'ile for age/sex/race  matched control); FFR 0.80 pLCx and 0.75 mLCx   Cataract    Diastolic dysfunction 08/12/2019   a.) TTE 08/12/2019: EF 55%, mild-mod AoV sclerosis without stenosis, mild-mod AR, G1DD.   ED (erectile dysfunction)    Edentulous    Elevated PSA    Epididymitis    Frequent falls    Gross hematuria    H/O TB (tuberculosis)    HOH (hard of hearing)    Hyperlipidemia    Hypertension    Long term current use of aspirin    Nocturia 12/19/2014   Occasional tremors    Pancytopenia (HCC)    Pericardial calcification    Pneumonia    Postoperative hypothyroidism    Severe protein-calorie malnutrition (HCC)    a.) with associated abnormal weight loss --> on mirtazapine + supplement shakes (Ensure)   Sinus bradycardia    Past Surgical History:  Procedure Laterality Date   COLONOSCOPY     EYE SURGERY Bilateral    TOTAL THYROIDECTOMY     Family History  Problem Relation Age of Onset   Prostate cancer Brother    Heart disease Mother    Bladder Cancer Neg Hx    Kidney cancer Neg Hx    Social History   Tobacco Use   Smoking status: Never    Passive exposure: Never   Smokeless tobacco: Never  Vaping Use   Vaping status: Never Used  Substance Use Topics   Alcohol use: No    Alcohol/week: 0.0 standard drinks of alcohol   Drug use: No    Pertinent Clinical Results:  LABS:   No visits with results within 3 Day(s) from this visit.  Latest known visit with results is:  Admission on 03/04/2023, Discharged on 03/04/2023  Component Date Value Ref Range Status   WBC 03/04/2023 5.5  4.0 - 10.5 K/uL Final   RBC 03/04/2023 3.52 (L)  4.22 - 5.81 MIL/uL Final   Hemoglobin 03/04/2023 11.3 (L)  13.0 - 17.0 g/dL Final   HCT 91/47/8295 35.0 (L)  39.0 - 52.0 % Final   MCV 03/04/2023 99.4  80.0 - 100.0 fL Final   MCH 03/04/2023 32.1  26.0 - 34.0 pg Final   MCHC 03/04/2023 32.3  30.0 - 36.0 g/dL Final   RDW 62/13/0865 15.0  11.5 - 15.5 % Final   Platelets 03/04/2023 164  150 - 400 K/uL Final    nRBC 03/04/2023 0.0  0.0 - 0.2 % Final   Neutrophils Relative % 03/04/2023 65  % Final   Neutro Abs 03/04/2023 3.6  1.7 - 7.7 K/uL Final   Lymphocytes Relative 03/04/2023 17  % Final   Lymphs Abs 03/04/2023 1.0  0.7 - 4.0 K/uL Final   Monocytes Relative 03/04/2023 12  % Final   Monocytes Absolute 03/04/2023 0.7  0.1 - 1.0 K/uL Final   Eosinophils Relative 03/04/2023 5  % Final   Eosinophils Absolute 03/04/2023 0.3  0.0 - 0.5 K/uL Final   Basophils Relative 03/04/2023 1  % Final   Basophils Absolute 03/04/2023 0.1  0.0 - 0.1 K/uL Final   Immature Granulocytes 03/04/2023 0  % Final   Abs Immature Granulocytes 03/04/2023 0.00  0.00 - 0.07 K/uL Final   Performed at  [facility], [address] [place], [place] [address]   Sodium 03/04/2023 141  135 - 145 mmol/L Final   Potassium 03/04/2023 4.4  3.5 - 5.1 mmol/L Final   Chloride  03/04/2023 109  98 - 111 mmol/L Final   CO2 03/04/2023 26  22 - 32 mmol/L Final   Glucose, Bld 03/04/2023 104 (H)  70 - 99 mg/dL Final   Glucose reference range applies only to samples taken after fasting for at least 8 hours.   BUN 03/04/2023 35 (H)  8 - 23 mg/dL Final   Creatinine, Ser 03/04/2023 2.16 (H)  0.61 - 1.24 mg/dL Final   Calcium 40/98/1191 8.5 (L)  8.9 - 10.3 mg/dL Final   Total Protein 47/82/9562 6.9  6.5 - 8.1 g/dL Final   Albumin 13/03/6577 3.6  3.5 - 5.0 g/dL Final   AST 46/96/2952 21  15 - 41 U/L Final   ALT 03/04/2023 12  0 - 44 U/L Final   Alkaline Phosphatase 03/04/2023 53  38 - 126 U/L Final   Total Bilirubin 03/04/2023 0.5  0.3 - 1.2 mg/dL Final   GFR, Estimated 03/04/2023 29 (L)  >60 mL/min Final   Comment: (NOTE) Calculated using the CKD-EPI Creatinine Equation (2021)    Anion gap 03/04/2023 6  5 - 15 Final   Performed at [facility], [address] [place], [place] [address]    ECG: Date: 03/28/2023 Time ECG obtained: 1027 AM Rate: 55 bpm Rhythm: sinus bradycardia Axis (leads I and aVF): Left axis  deviation Intervals: PR 160 ms. QRS 90 ms. QTc 419 ms. ST segment and T wave changes: Nonspecific T wave abnormality  comparison: Similar to previous tracing obtained on 12/06/2021; PVCs no longer present.   IMAGING / PROCEDURES: CT ABDOMEN PELVIS WO CONTRAST performed on 01/25/2023 No urinary tract calculi or obstructive uropathy within either kidney. Stable marked enlargement of the prostate. Aortic atherosclerosis Stable pericardial calcifications.  TRANSTHORACIC ECHOCARDIOGRAM performed on 08/12/2019 Left ventricular ejection fraction, by visual estimation, is 55%. The left ventricle has normal function. There is no left ventricular hypertrophy.  Left ventricular diastolic parameters are consistent with Grade I diastolic dysfunction (impaired relaxation).  The left ventricle has no regional wall motion abnormalities.  Global right ventricle has normal systolic function.The right ventricular size is normal. No increase in right ventricular wall thickness.  Left atrial size was normal.  Aortic valve regurgitation is mild to moderate. Mild to moderate aortic valve sclerosis/calcification without any evidence of aortic stenosis.  TR signal is inadequate for assessing pulmonary artery systolic pressure.  The inferior vena cava is dilated in size with <50% respiratory variability, suggesting right atrial pressure of 15 mmHg.   CT CORONARY MORPH W/CTA COR W/SCORE W/CA W/CM &/OR WO/CM W/ FRACTIONAL FLOW RESERVE DATA PREP performed on 05/27/2019 Spectrum of findings at the lung bases that may indicate a fibrotic interstitial lung disease. If there is clinical concern for interstitial lung disease, a dedicated high-resolution chest CT study may be obtained for further evaluation. Chronic pericardial calcification. Aortic atherosclerosis Coronary artery calcium score 250 Agatston units. This places the patient in the 52nd percentile for age and gender, suggesting intermediate risk for future  cardiac events. Nonobstructive disease in the LAD. Ostial LCx stenosis appears to be around 50%, but will send for FFR to confirm (ranges: < 0.75 high likelihood of hemodynamically significant stenosis, 0.76-0.80 borderline, > 0.80 normal): FFR 0.8 in the proximal LCx and 0.75 in the mid LCx.  4.  Pericardial calcification noted, especially adjacent to the RV.  Impression and Plan:  Tyrone Keith has been referred for pre-anesthesia review and clearance prior to him undergoing the planned anesthetic and procedural courses. Available labs, pertinent testing, and  imaging results were personally reviewed by me in preparation for upcoming operative/procedural course. [facility] medical record has been updated following extensive record review and patient interview with PAT staff.   This patient has been appropriately cleared by cardiology with an overall LOW risk of experiencing significant perioperative cardiovascular complications. Based on clinical review performed today (04/01/23), barring any significant acute changes in the patient's overall condition, it is anticipated that he will be able to proceed with the planned surgical intervention. Any acute changes in clinical condition may necessitate his procedure being postponed and/or cancelled. Patient will meet with anesthesia team (MD and/or CRNA) on the day of his procedure for preoperative evaluation/assessment. Questions regarding anesthetic course will be fielded at that time.   Pre-surgical instructions were reviewed with the patient during his PAT appointment, and questions were fielded to satisfaction by PAT clinical staff. He has been instructed on which medications that he will need to hold prior to surgery, as well as the ones that have been deemed safe/appropriate to take on the day of his procedure. As part of the general education provided by PAT, patient made aware both verbally and in writing, that he would need to abstain from the use of  any illegal substances during his perioperative course.  He was advised that failure to follow the provided instructions could necessitate case cancellation or result in serious perioperative complications up to and including death. Patient encouraged to contact PAT and/or his surgeon's office to discuss any questions or concerns that may arise prior to surgery; verbalized understanding.   Quentin Mulling, MSN, APRN, FNP-C, CEN [facility]  Peri-operative Services Nurse Practitioner Phone: [phone] Fax: [phone] Email: [email or web address] 04/01/23 1:05 PM  NOTE: This note has been prepared using Scientist, clinical (histocompatibility and immunogenetics). Despite my best ability to proofread, there is always the potential that unintentional transcriptional errors may still occur from this process.

## 2023-04-03 MED ORDER — CIPROFLOXACIN IN D5W 400 MG/200ML IV SOLN
400.0000 mg | INTRAVENOUS | Status: AC
  Administered 2023-04-04: 400 mg via INTRAVENOUS

## 2023-04-03 MED ORDER — FAMOTIDINE 20 MG PO TABS
20.0000 mg | ORAL_TABLET | Freq: Once | ORAL | Status: AC
  Administered 2023-04-04: 20 mg via ORAL

## 2023-04-03 MED ORDER — ORAL CARE MOUTH RINSE: 15.0000 mL | Freq: Once | OROMUCOSAL | Status: AC

## 2023-04-03 MED ORDER — CHLORHEXIDINE GLUCONATE 0.12 % MT SOLN
15.0000 mL | Freq: Once | OROMUCOSAL | Status: AC
  Administered 2023-04-04: 15 mL via OROMUCOSAL

## 2023-04-03 MED ORDER — LACTATED RINGERS IV SOLN: INTRAVENOUS | Status: DC

## 2023-04-04 ENCOUNTER — Ambulatory Visit: Payer: Medicare Other | Admitting: Urgent Care

## 2023-04-04 ENCOUNTER — Encounter
Admission: RE | Disposition: A | Discharge status: Home / Self Care | Payer: Self-pay | Source: Home / Self Care | Attending: Urology

## 2023-04-04 ENCOUNTER — Ambulatory Visit
Admission: RE | Admit: 2023-04-04 | Discharge: 2023-04-04 | Disposition: A | Discharge status: Home / Self Care | Payer: Medicare Other | Attending: Urology | Admitting: Urology

## 2023-04-04 ENCOUNTER — Encounter: Payer: Self-pay | Admitting: Urology

## 2023-04-04 ENCOUNTER — Other Ambulatory Visit: Payer: Self-pay

## 2023-04-04 DIAGNOSIS — G309 Alzheimer's disease, unspecified: Secondary | ICD-10-CM | POA: Diagnosis not present

## 2023-04-04 DIAGNOSIS — F0280 Dementia in other diseases classified elsewhere without behavioral disturbance: Secondary | ICD-10-CM | POA: Insufficient documentation

## 2023-04-04 DIAGNOSIS — N138 Other obstructive and reflux uropathy: Secondary | ICD-10-CM

## 2023-04-04 DIAGNOSIS — R310 Gross hematuria: Secondary | ICD-10-CM | POA: Diagnosis not present

## 2023-04-04 DIAGNOSIS — R339 Retention of urine, unspecified: Secondary | ICD-10-CM | POA: Insufficient documentation

## 2023-04-04 DIAGNOSIS — N401 Enlarged prostate with lower urinary tract symptoms: Secondary | ICD-10-CM | POA: Diagnosis present

## 2023-04-04 HISTORY — DX: Repeated falls: R29.6

## 2023-04-04 HISTORY — DX: Long term (current) use of aspirin: Z79.82

## 2023-04-04 HISTORY — DX: Localized edema: R60.0

## 2023-04-04 HISTORY — DX: Thyrotoxicosis with diffuse goiter without thyrotoxic crisis or storm: E05.00

## 2023-04-04 HISTORY — DX: Postprocedural hypothyroidism: E89.0

## 2023-04-04 HISTORY — PX: HOLEP-LASER ENUCLEATION OF THE PROSTATE WITH MORCELLATION: SHX6641

## 2023-04-04 HISTORY — DX: Atherosclerosis of aorta: I70.0

## 2023-04-04 SURGERY — ENUCLEATION, PROSTATE, USING LASER, WITH MORCELLATION
Anesthesia: General

## 2023-04-04 MED ORDER — CIPROFLOXACIN IN D5W 400 MG/200ML IV SOLN
INTRAVENOUS | Status: AC
  Filled 2023-04-04: qty 200

## 2023-04-04 MED ORDER — PROPOFOL 10 MG/ML IV BOLUS
INTRAVENOUS | Status: AC
  Filled 2023-04-04: qty 20

## 2023-04-04 MED ORDER — FENTANYL CITRATE (PF) 100 MCG/2ML IJ SOLN
INTRAMUSCULAR | Status: AC
  Filled 2023-04-04: qty 2

## 2023-04-04 MED ORDER — ROCURONIUM BROMIDE 100 MG/10ML IV SOLN
INTRAVENOUS | Status: DC | PRN
  Administered 2023-04-04 (×4): 10 mg via INTRAVENOUS
  Administered 2023-04-04: 40 mg via INTRAVENOUS

## 2023-04-04 MED ORDER — LIDOCAINE HCL (CARDIAC) PF 100 MG/5ML IV SOSY
PREFILLED_SYRINGE | INTRAVENOUS | Status: DC | PRN
  Administered 2023-04-04: 60 mg via INTRAVENOUS

## 2023-04-04 MED ORDER — HYDROCODONE-ACETAMINOPHEN 5-325 MG PO TABS: 1.0000 | ORAL_TABLET | Freq: Four times a day (QID) | ORAL | 0 refills | Status: AC | PRN

## 2023-04-04 MED ORDER — OXYCODONE HCL 5 MG/5ML PO SOLN: 5.0000 mg | Freq: Once | ORAL | Status: DC | PRN

## 2023-04-04 MED ORDER — FENTANYL CITRATE (PF) 100 MCG/2ML IJ SOLN: 25.0000 ug | INTRAMUSCULAR | Status: DC | PRN

## 2023-04-04 MED ORDER — ACETAMINOPHEN 10 MG/ML IV SOLN
INTRAVENOUS | Status: DC | PRN
  Administered 2023-04-04: 1000 mg via INTRAVENOUS

## 2023-04-04 MED ORDER — ACETAMINOPHEN 10 MG/ML IV SOLN: 1000.0000 mg | Freq: Once | INTRAVENOUS | Status: DC | PRN

## 2023-04-04 MED ORDER — FENTANYL CITRATE (PF) 100 MCG/2ML IJ SOLN
INTRAMUSCULAR | Status: DC | PRN
  Administered 2023-04-04 (×4): 25 ug via INTRAVENOUS

## 2023-04-04 MED ORDER — LIDOCAINE HCL (PF) 2 % IJ SOLN
INTRAMUSCULAR | Status: AC
  Filled 2023-04-04: qty 5

## 2023-04-04 MED ORDER — ACETAMINOPHEN 10 MG/ML IV SOLN
INTRAVENOUS | Status: AC
  Filled 2023-04-04: qty 100

## 2023-04-04 MED ORDER — PROMETHAZINE HCL 25 MG/ML IJ SOLN: 6.2500 mg | INTRAMUSCULAR | Status: DC | PRN

## 2023-04-04 MED ORDER — DROPERIDOL 2.5 MG/ML IJ SOLN: 0.6250 mg | Freq: Once | INTRAMUSCULAR | Status: DC | PRN

## 2023-04-04 MED ORDER — ROCURONIUM BROMIDE 10 MG/ML (PF) SYRINGE
PREFILLED_SYRINGE | INTRAVENOUS | Status: AC
  Filled 2023-04-04: qty 10

## 2023-04-04 MED ORDER — OXYCODONE HCL 5 MG PO TABS: 5.0000 mg | ORAL_TABLET | Freq: Once | ORAL | Status: DC | PRN

## 2023-04-04 MED ORDER — ONDANSETRON HCL 4 MG/2ML IJ SOLN
INTRAMUSCULAR | Status: DC | PRN
  Administered 2023-04-04: 4 mg via INTRAVENOUS

## 2023-04-04 MED ORDER — ONDANSETRON HCL 4 MG/2ML IJ SOLN
INTRAMUSCULAR | Status: AC
  Filled 2023-04-04: qty 2

## 2023-04-04 MED ORDER — SUGAMMADEX SODIUM 200 MG/2ML IV SOLN
INTRAVENOUS | Status: DC | PRN
  Administered 2023-04-04: 150 mg via INTRAVENOUS

## 2023-04-04 MED ORDER — FAMOTIDINE 20 MG PO TABS
ORAL_TABLET | ORAL | Status: AC
  Filled 2023-04-04: qty 1

## 2023-04-04 MED ORDER — SODIUM CHLORIDE 0.9 % IR SOLN
Status: DC | PRN
  Administered 2023-04-04 (×3): 12000 mL

## 2023-04-04 MED ORDER — PROPOFOL 10 MG/ML IV BOLUS
INTRAVENOUS | Status: DC | PRN
Start: 2023-04-04 — End: 2023-04-04
  Administered 2023-04-04: 70 mg via INTRAVENOUS

## 2023-04-04 MED ORDER — CHLORHEXIDINE GLUCONATE 0.12 % MT SOLN
OROMUCOSAL | Status: AC
  Filled 2023-04-04: qty 15

## 2023-04-04 SURGICAL SUPPLY — 36 items
ADAPTER IRRIG TUBE 2 SPIKE SOL (ADAPTER) ×2 IMPLANT
ADPR TBG 2 SPK PMP STRL ASCP (ADAPTER) ×2
BAG DRN LRG CPC RND TRDRP CNTR (MISCELLANEOUS) ×1
BAG DRN RND TRDRP ANRFLXCHMBR (UROLOGICAL SUPPLIES) ×1
BAG URINE DRAIN 2000ML AR STRL (UROLOGICAL SUPPLIES) ×1 IMPLANT
BAG URO DRAIN 4000ML (MISCELLANEOUS) ×1 IMPLANT
CATH FOLEY 3WAY 30CC 24FR (CATHETERS) ×1
CATH URETL OPEN END 4X70 (CATHETERS) ×1 IMPLANT
CATH URTH STD 24FR FL 3W 2 (CATHETERS) ×1 IMPLANT
CONTAINER COLLECT MORCELLATR (MISCELLANEOUS) ×1 IMPLANT
DRAPE UTILITY 15X26 TOWEL STRL (DRAPES) IMPLANT
FIBER LASER MOSES 550 DFL (Laser) ×1 IMPLANT
FILTER OVERFLOW MORCELLATOR (FILTER) ×1 IMPLANT
GLOVE BIOGEL PI IND STRL 7.5 (GLOVE) ×1 IMPLANT
GOWN STRL REUS W/ TWL LRG LVL3 (GOWN DISPOSABLE) ×1 IMPLANT
GOWN STRL REUS W/ TWL XL LVL3 (GOWN DISPOSABLE) ×1 IMPLANT
GOWN STRL REUS W/TWL LRG LVL3 (GOWN DISPOSABLE) ×1
GOWN STRL REUS W/TWL XL LVL3 (GOWN DISPOSABLE) ×1
HOLDER FOLEY CATH W/STRAP (MISCELLANEOUS) ×1 IMPLANT
IV NS IRRIG 3000ML ARTHROMATIC (IV SOLUTION) ×5 IMPLANT
KIT TURNOVER CYSTO (KITS) ×1 IMPLANT
LOOP CUT BIPOLAR 24F LRG (ELECTROSURGICAL) IMPLANT
MBRN O SEALING YLW 17 FOR INST (MISCELLANEOUS) ×1
MEMBRANE SLNG YLW 17 FOR INST (MISCELLANEOUS) ×1 IMPLANT
MORCELLATOR COLLECT CONTAINER (MISCELLANEOUS) ×1
MORCELLATOR OVERFLOW FILTER (FILTER) ×1
MORCELLATOR ROTATION 4.75 335 (MISCELLANEOUS) ×1 IMPLANT
PACK CYSTO AR (MISCELLANEOUS) ×1 IMPLANT
SET CYSTO W/LG BORE CLAMP LF (SET/KITS/TRAYS/PACK) ×1 IMPLANT
SET IRRIG Y TYPE TUR BLADDER L (SET/KITS/TRAYS/PACK) ×1 IMPLANT
SLEEVE PROTECTION STRL DISP (MISCELLANEOUS) ×2 IMPLANT
SURGILUBE 2OZ TUBE FLIPTOP (MISCELLANEOUS) ×1 IMPLANT
SYR TOOMEY IRRIG 70ML (MISCELLANEOUS) ×1
SYRINGE TOOMEY IRRIG 70ML (MISCELLANEOUS) ×1 IMPLANT
TUBE PUMP MORCELLATOR PIRANHA (TUBING) ×1 IMPLANT
WATER STERILE IRR 1000ML POUR (IV SOLUTION) ×1 IMPLANT

## 2023-04-04 NOTE — Anesthesia Procedure Notes (Signed)
Procedure Name: Intubation Date/Time: 04/04/2023 7:38 AM  Performed by: Elmarie Mainland, CRNAPre-anesthesia Checklist: Patient identified, Emergency Drugs available, Suction available and Patient being monitored Preoxygenation: Pre-oxygenation with 100% oxygen Induction Type: IV induction Ventilation: Two handed mask ventilation required and Oral airway inserted - appropriate to patient size Laryngoscope Size: McGraph and 4 Grade View: Grade I Tube type: Oral Number of attempts: 1 Airway Equipment and Method: Stylet and Video-laryngoscopy Placement Confirmation: ETT inserted through vocal cords under direct vision, positive ETCO2 and breath sounds checked- equal and bilateral Secured at: 22 cm Tube secured with: Tape Dental Injury: Teeth and Oropharynx as per pre-operative assessment

## 2023-04-04 NOTE — Op Note (Signed)
Date of procedure: 04/04/23  Preoperative diagnosis:  BPH with retention  Postoperative diagnosis:  Same  Procedure: HoLEP (Holmium Laser Enucleation of the Prostate)  Surgeon: Legrand Rams, MD  Anesthesia: General  Complications: None  Intraoperative findings:  Large vascular prostate, moderate bladder trabeculations, no suspicious lesions, ureteral orifices orthotopic bilaterally Verumontanum and ureteral orifices intact at conclusion of case  EBL: 25 mL  Specimens: Prostate chips  Enucleation time: 31 minutes  Morcellation time: 50 minutes  Intra-op weight: 90g  Drains: 24 French three-way, 6 to cc in balloon  Indication: Tyrone Keith is a 89 y.o. patient with dementia, BPH, Foley dependent retention, recurrent gross hematuria and UTIs who opted for HOLEP to try to become catheter free.  After reviewing the management options for treatment, they elected to proceed with the above surgical procedure(s). We have discussed the potential benefits and risks of the procedure, side effects of the proposed treatment, the likelihood of the patient achieving the goals of the procedure, and any potential problems that might occur during the procedure or recuperation.  We specifically discussed the risks of bleeding, infection, hematuria and clot retention, need for additional procedures, possible overnight hospital stay, temporary urgency and incontinence, rare long-term incontinence, and retrograde ejaculation.  Informed consent has been obtained.   Description of procedure:  The patient was taken to the operating room and general anesthesia was induced.  The patient was placed in the dorsal lithotomy position, prepped and draped in the usual sterile fashion, and preoperative antibiotics(Cipro) were administered.  SCDs were placed for DVT prophylaxis.  A preoperative time-out was performed.   Tyrone Keith sounds were used to gently dilated the urethra up to 80F. The 59 French continuous  flow resectoscope was inserted into the urethra using the visual obturator  The prostate was large with a high bladder neck and obstructing lateral lobes. The bladder was thoroughly inspected and notable for moderate to severe trabeculations and small diverticula but no suspicious lesions.  The ureteral orifices were located in orthotopic position and well away from the bladder neck.    The laser was set to 2 J and 60 Hz and early apical release was performed by making a circumferential mucosal incision proximal to the sphincter.  A lambda incision was then made proximal to the verumontanum.  The prostate was enucleated en bloc circumferentially into the bladder.    The capsule was examined and laser was used for meticulous hemostasis.    The 36 French resectoscope was then switched out for the 26 French nephroscope and prostate tissue was morcellated(Piranha).  Majority of the tissue could be morcellated with the Piranha, however there was a 2 to 3 cm beach ball sized sphere of tissue that could not be morcellated.  I advanced the bipolar resectoscope with a large loop and this was resected down to small chips and irrigated free from the bladder.  The resectoscope was used around the bladder neck and prostatic fossa for hemostasis.  Prostate tissue sent to pathology.  A 24 French three-way catheter was inserted easily with the aid of a catheter urine, and 60 cc were placed in the balloon.  Urine was light red.  The catheter irrigated easily with a Toomey syringe.  CBI was initiated.   The patient tolerated the procedure well without any immediate complications and was extubated and transferred to the recovery room in stable condition.  Urine was pink on fast CBI.  Disposition: Stable to PACU  Plan: Wean CBI in PACU, anticipate discharge home  today with Foley removal in clinic in 2-3 days  Legrand Rams, MD 04/04/2023

## 2023-04-04 NOTE — H&P (Signed)
04/04/23 7:03 AM   Tyrone Keith 1932/11/29 [id number]  CC: BPH, retention  HPI: Comorbid 89 year old male with dementia and Foley dependent urinary retention.  He did not do well with CIC and developed recurrent gross hematuria and clot retention.  With his dementia has been manipulating the catheter, and family opted for any option to get him catheter free.  Prostate measures 150 g on most recent CT.   PMH: Past Medical History:  Diagnosis Date   Acute kidney failure (HCC)    Alzheimer's dementia (HCC)    a.) on acetylcholinesterase inhibitor (donepazil)   Aortic atherosclerosis (HCC)    Atrial fibrillation (HCC)    a.) CHA2DS2-VASc: 46 (age x2, HTN, vascular disease history); b.) rate/rhythm maintained intrinsically without pharmacological intervention; no chronic oral anticoagulation   Basedows disease (Graves disease)    Benign fibroma of prostate    Bilateral lower extremity edema    BPH (benign prostatic hyperplasia) 12/19/2014   CAD (coronary artery disease) 05/27/2019   a.) cCTA 05/27/2019: Ca2+ 250 (52nd %'ile for age/sex/race matched control); FFR 0.80 pLCx and 0.75 mLCx   Cataract    Diastolic dysfunction 08/12/2019   a.) TTE 08/12/2019: EF 55%, mild-mod AoV sclerosis without stenosis, mild-mod AR, G1DD.   ED (erectile dysfunction)    Edentulous    Elevated PSA    Epididymitis    Frequent falls    Gross hematuria    H/O TB (tuberculosis)    HOH (hard of hearing)    Hyperlipidemia    Hypertension    Long term current use of aspirin    Nocturia 12/19/2014   Occasional tremors    Pancytopenia (HCC)    Pericardial calcification    Pneumonia    Postoperative hypothyroidism    Severe protein-calorie malnutrition (HCC)    a.) with associated abnormal weight loss --> on mirtazapine + supplement shakes (Ensure)   Sinus bradycardia     Surgical History: Past Surgical History:  Procedure Laterality Date   COLONOSCOPY     EYE SURGERY Bilateral    TOTAL  THYROIDECTOMY       Family History: Family History  Problem Relation Age of Onset   Prostate cancer Brother    Heart disease Mother    Bladder Cancer Neg Hx    Kidney cancer Neg Hx     Social History:  reports that he has never smoked. He has never been exposed to tobacco smoke. He has never used smokeless tobacco. He reports that he does not drink alcohol and does not use drugs.  Physical Exam: BP (!) 120/52   Pulse 73   Temp 97.6 F (36.4 C) (Temporal)   Resp 17   SpO2 100%    Constitutional:  Alert and oriented, No acute distress. Cardiovascular: RRR Respiratory: CTA bilaterally GI: Abdomen is soft, nontender, nondistended, no abdominal masses   Laboratory Data: Culture 7/29 with Enterococcus, has been on culture appropriate Augmentin  Assessment & Plan:   Comorbid 89 year old male with dementia and Foley dependent urinary retention felt to be secondary to BPH.  He has failed multiple voiding trials, did not tolerate CIC secondary to recurrent gross hematuria and clot retention.  Family understands higher risk of incontinence with his age and dementia, but willing to accept those risks with his ongoing problems with catheter.  We discussed the risks and benefits of HoLEP at length.  The procedure requires general anesthesia and takes 1 to 2 hours, and a holmium laser is used to enucleate the prostate and push  this tissue into the bladder.  A morcellator is then used to remove this tissue, which is sent for pathology.  The vast majority(>95%) of patients are able to discharge the same day with a catheter in place for 2 to 3 days, and will follow-up in clinic for a voiding trial.  We specifically discussed the risks of bleeding, infection, retrograde ejaculation, temporary urgency and urge incontinence, very low risk of long-term incontinence, urethral stricture/bladder neck contracture, pathologic evaluation of prostate tissue and possible detection of prostate cancer or other  malignancy, and possible need for additional procedures.  HOLEP today  Legrand Rams, MD 04/04/2023  [facility] [address] [place], [place] [address] [phone]

## 2023-04-04 NOTE — Transfer of Care (Signed)
Immediate Anesthesia Transfer of Care Note  Patient: Shorty Schoene  Procedure(s) Performed: HOLEP-LASER ENUCLEATION OF THE PROSTATE WITH MORCELLATION  Patient Location: PACU  Anesthesia Type:General  Level of Consciousness: drowsy and patient cooperative  Airway & Oxygen Therapy: Patient Spontanous Breathing and Patient connected to face mask oxygen  Post-op Assessment: Report given to RN and Post -op Vital signs reviewed and stable  Post vital signs: Reviewed and stable  Last Vitals:  Vitals Value Taken Time  BP 116/66 04/04/23 0941  Temp    Pulse 37 04/04/23 0944  Resp 11 04/04/23 0944  SpO2 96 % 04/04/23 0944  Vitals shown include unfiled device data.  Last Pain:  Vitals:   04/04/23 0628  TempSrc: Temporal  PainSc: 0-No pain         Complications: No notable events documented.

## 2023-04-04 NOTE — Anesthesia Preprocedure Evaluation (Signed)
Anesthesia Evaluation  Patient identified by MRN, date of birth, ID band Patient awake    Reviewed: Allergy & Precautions, H&P , NPO status , Patient's Chart, lab work & pertinent test results, reviewed documented beta blocker date and time   Airway Mallampati: II  TM Distance: >3 FB Neck ROM: full    Dental  (+) Upper Dentures, Lower Dentures   Pulmonary shortness of breath and with exertion, pneumonia, resolved   Pulmonary exam normal        Cardiovascular Exercise Tolerance: Poor hypertension, On Medications (-) angina + CAD  (-) DVT Atrial Fibrillation  Rhythm:regular Rate:Normal     Neuro/Psych  PSYCHIATRIC DISORDERS     Dementia negative neurological ROS     GI/Hepatic negative GI ROS, Neg liver ROS,,,  Endo/Other  Hypothyroidism    Renal/GU Renal disease  negative genitourinary   Musculoskeletal   Abdominal   Peds  Hematology  (+) Blood dyscrasia, anemia   Anesthesia Other Findings Past Medical History: No date: Acute kidney failure (HCC) No date: Alzheimer's dementia (HCC)     Comment:  a.) on acetylcholinesterase inhibitor (donepazil) No date: Aortic atherosclerosis (HCC) No date: Atrial fibrillation (HCC)     Comment:  a.) CHA2DS2-VASc: 51 (age x2, HTN, vascular disease               history); b.) rate/rhythm maintained intrinsically               without pharmacological intervention; no chronic oral               anticoagulation No date: Basedows disease (Graves disease) No date: Benign fibroma of prostate No date: Bilateral lower extremity edema 12/19/2014: BPH (benign prostatic hyperplasia) 05/27/2019: CAD (coronary artery disease)     Comment:  a.) cCTA 05/27/2019: Ca2+ 250 (52nd %'ile for               age/sex/race matched control); FFR 0.80 pLCx and 0.75               mLCx No date: Cataract 08/12/2019: Diastolic dysfunction     Comment:  a.) TTE 08/12/2019: EF 55%, mild-mod AoV sclerosis                without stenosis, mild-mod AR, G1DD. No date: ED (erectile dysfunction) No date: Edentulous No date: Elevated PSA No date: Epididymitis No date: Frequent falls No date: Gross hematuria No date: H/O TB (tuberculosis) No date: HOH (hard of hearing) No date: Hyperlipidemia No date: Hypertension No date: Long term current use of aspirin 12/19/2014: Nocturia No date: Occasional tremors No date: Pancytopenia (HCC) No date: Pericardial calcification No date: Pneumonia No date: Postoperative hypothyroidism No date: Severe protein-calorie malnutrition (HCC)     Comment:  a.) with associated abnormal weight loss --> on               mirtazapine + supplement shakes (Ensure) No date: Sinus bradycardia Past Surgical History: No date: COLONOSCOPY No date: EYE SURGERY; Bilateral No date: TOTAL THYROIDECTOMY   Reproductive/Obstetrics negative OB ROS                             Anesthesia Physical Anesthesia Plan  ASA: 3  Anesthesia Plan: General ETT   Post-op Pain Management:    Induction:   PONV Risk Score and Plan: 3  Airway Management Planned:   Additional Equipment:   Intra-op Plan:   Post-operative Plan:   Informed Consent: I  have reviewed the patients History and Physical, chart, labs and discussed the procedure including the risks, benefits and alternatives for the proposed anesthesia with the patient or authorized representative who has indicated his/her understanding and acceptance.     Dental Advisory Given  Plan Discussed with: CRNA  Anesthesia Plan Comments:        Anesthesia Quick Evaluation

## 2023-04-04 NOTE — Discharge Instructions (Signed)

## 2023-04-08 ENCOUNTER — Encounter: Payer: Self-pay | Admitting: Physician Assistant

## 2023-04-08 ENCOUNTER — Ambulatory Visit: Payer: Medicare Other | Admitting: Physician Assistant

## 2023-04-08 VITALS — BP 110/70 | HR 68 | Ht 74.0 in | Wt 158.0 lb

## 2023-04-08 DIAGNOSIS — N138 Other obstructive and reflux uropathy: Secondary | ICD-10-CM

## 2023-04-08 DIAGNOSIS — N401 Enlarged prostate with lower urinary tract symptoms: Secondary | ICD-10-CM

## 2023-04-08 NOTE — Progress Notes (Signed)
Catheter Removal  Patient is present today for a catheter removal.  61ml of water was drained from the balloon. A 24FR three-way foley cath was removed from the bladder, no complications were noted. Patient tolerated well.  Performed by: Carman Ching, PA-C   Additional notes: Counseled patient on normal postoperative findings including dysuria, gross hematuria, and urinary urgency/leakage. Counseled patient to wear absorbent products as needed for security. Written and verbal resources provided today. Surgical pathology pending; will defer to Dr. Richardo Hanks to share results when available.   Follow up: Return in about 4 months (around 08/08/2023) for Postop f/u with Dr. Richardo Hanks.

## 2023-04-08 NOTE — Anesthesia Postprocedure Evaluation (Signed)
Anesthesia Post Note  Patient: Tyrone Keith  Procedure(s) Performed: HOLEP-LASER ENUCLEATION OF THE PROSTATE WITH MORCELLATION  Patient location during evaluation: PACU Anesthesia Type: General Level of consciousness: awake and alert Pain management: pain level controlled Vital Signs Assessment: post-procedure vital signs reviewed and stable Respiratory status: spontaneous breathing, nonlabored ventilation, respiratory function stable and patient connected to nasal cannula oxygen Cardiovascular status: blood pressure returned to baseline and stable Postop Assessment: no apparent nausea or vomiting Anesthetic complications: no   No notable events documented.   Last Vitals:  Vitals:   04/04/23 1045 04/04/23 1101  BP: 128/77 (!) 146/54  Pulse: 61 65  Resp: 11 17  Temp: (!) 36.2 C 36.5 C  SpO2: 100% 100%    Last Pain:  Vitals:   04/04/23 1101  TempSrc: Temporal  PainSc: 0-No pain                 Yevette Edwards

## 2023-04-08 NOTE — Patient Instructions (Addendum)
Congratulations on your recent HOLEP procedure! As discussed in clinic today, these are the three normal postoperative findings after this surgery: Burning or pain with urination: This typically resolves within 1 week of surgery. If you are still having significant pain with urination 10 days after surgery, please call our clinic. We may need to check you for a urinary tract infection at that point, though this is rare. Blood in the urine: This may either come and go or steadily improve before going away, but typically resolves completely within 3 weeks of surgery. If you are on blood thinners, it may take longer for the bleeding to resolve. Please note that you may find that you pass clumps of tissue or debris around 10-14 days after surgery associated with some new bleeding. This is due to sloughing of your postoperative scab and is also normal. If at any point you start to pass dark red urine; thick, ketchup-like urine; or large blood clots around the size of your palm, please call our office immediately. Urinary leakage or urgency: This tends to improve with time, with most patients becoming dry within around 3 months of surgery. You may wear absorbant underwear or liners for security during this time.

## 2023-04-09 ENCOUNTER — Telehealth: Payer: Self-pay

## 2023-04-09 NOTE — Telephone Encounter (Signed)
-----   Message from Sondra Come sent at 04/09/2023 11:21 AM EDT ----- Randie Heinz news, no evidence of prostate cancer on HOLEP tissue.  Continue Kegel exercises and follow-up as scheduled  Legrand Rams, MD 04/09/2023

## 2023-04-09 NOTE — Telephone Encounter (Signed)
Called pt, no answer. LM for patient or wife to call back. 1st attempt.

## 2023-04-14 NOTE — Telephone Encounter (Signed)
Called pt's wife per DPR informed her of the information below. Wife voiced understanding.

## 2023-08-07 ENCOUNTER — Ambulatory Visit: Payer: Medicare Other | Admitting: Urology

## 2024-01-02 ENCOUNTER — Encounter: Payer: Self-pay | Admitting: Nurse Practitioner

## 2024-01-02 ENCOUNTER — Ambulatory Visit: Attending: Nurse Practitioner | Admitting: Physician Assistant

## 2024-01-02 VITALS — BP 118/65 | Ht 70.0 in | Wt 166.6 lb

## 2024-01-02 DIAGNOSIS — I10 Essential (primary) hypertension: Secondary | ICD-10-CM | POA: Diagnosis not present

## 2024-01-02 DIAGNOSIS — I480 Paroxysmal atrial fibrillation: Secondary | ICD-10-CM | POA: Diagnosis not present

## 2024-01-02 DIAGNOSIS — M25421 Effusion, right elbow: Secondary | ICD-10-CM

## 2024-01-02 DIAGNOSIS — I2510 Atherosclerotic heart disease of native coronary artery without angina pectoris: Secondary | ICD-10-CM | POA: Diagnosis not present

## 2024-01-02 DIAGNOSIS — M7989 Other specified soft tissue disorders: Secondary | ICD-10-CM | POA: Diagnosis not present

## 2024-01-02 NOTE — Progress Notes (Signed)
 Cardiology Office Note    Date:  01/02/2024   ID:  Tyrone Keith, DOB 09-28-1932, MRN [id number]  PCP:  Nestor Banter, MD  Cardiologist:  Sammy Crisp, MD  Electrophysiologist:  None   Chief Complaint: Follow up  History of Present Illness:   Tyrone Keith is a 89 y.o. male with history of Alzheimer's disease, single-vessel CAD involving ostial LCx by coronary CTA, pericardial calcification (incidentally noted), hypertension, isolated episode of atrial fibrillation in 2018, hypertension, Graves' disease with postoperative hypothyroidism, and BPH who presents for follow up on CAD and atrial fibrillation.    Patient had an EKG demonstrating atrial fibrillation in 2018.  He underwent an exercise myocardial perfusion stress test at that time which was without evidence of ischemia, LVEF 48%.  He established with Dr. Nolan Battle 05/2019 after being referred for incidental finding of pericardial calcification and chest pain.  Coronary CTA 05/2019 showed pericardial calcification, similar to what was seen on the prior CT as well as single-vessel CAD involving ostial Lcx (CT FFR 0.8 in proximal LCx and 0.75 in mid vessel) .  He was subsequently started on isosorbide .  Echo done 08/2019 showed EF 55 to 60%, G1 DD, mild aortic regurgitation, and right atrial pressure of 15 mmHg.   Patient was most recently seen by Dr. Nolan Battle 03/28/2023 and overall doing well from a cardiac perspective.  He did endorse some new lower extremity swelling.  Conservative management was recommended. Patient and his wife deferred repeat echocardiogram.  Patient is doing well from a cardiac perspective today without symptoms of angina or cardiac decompensation.  He presents today with his wife who assists with the history in the setting of his Alzheimer's dementia.  She reports that he fell this past Sunday while walking out of church.  He was talking to someone behind him and missed a step causing mechanical fall.  He did not hit his  head.  He does have a scrape on his left arm and his right elbow is swollen.  His wife reports he has otherwise been doing well and is back to having an appetite after it was previously reduced during issues with his prostate.  He denies chest pain, shortness of breath, palpitations, lightheadedness, dizziness, orthopnea, and PND.  Labs independently reviewed: 07/14/2023-NA 146, K4.7, BUN 27, creatinine 1.2, normal LFTs, Hgb 12.4, HCT 38.6, TC 139, TG 32, HDL 56, LDL 77, TSH within normal limits  Objective   Past Medical History:  Diagnosis Date   Acute kidney failure (HCC)    Alzheimer's dementia (HCC)    a.) on acetylcholinesterase inhibitor (donepazil)   Aortic atherosclerosis (HCC)    Atrial fibrillation (HCC)    a.) CHA2DS2-VASc: 79 (age x2, HTN, vascular disease history); b.) rate/rhythm maintained intrinsically without pharmacological intervention; no chronic oral anticoagulation   Basedows disease (Graves disease)    Benign fibroma of prostate    Bilateral lower extremity edema    BPH (benign prostatic hyperplasia) 12/19/2014   CAD (coronary artery disease) 05/27/2019   a.) cCTA 05/27/2019: Ca2+ 250 (52nd %'ile for age/sex/race matched control); FFR 0.80 pLCx and 0.75 mLCx   Cataract    Diastolic dysfunction 08/12/2019   a.) TTE 08/12/2019: EF 55%, mild-mod AoV sclerosis without stenosis, mild-mod AR, G1DD.   ED (erectile dysfunction)    Edentulous    Elevated PSA    Epididymitis    Frequent falls    Gross hematuria    H/O TB (tuberculosis)    HOH (hard of hearing)    Hyperlipidemia  Hypertension    Long term current use of aspirin     Nocturia 12/19/2014   Occasional tremors    Pancytopenia (HCC)    Pericardial calcification    Pneumonia    Postoperative hypothyroidism    Severe protein-calorie malnutrition (HCC)    a.) with associated abnormal weight loss --> on mirtazapine  + supplement shakes (Ensure)   Sinus bradycardia     Current Medications: Current Meds   Medication Sig   aspirin  EC 81 MG tablet Take 1 tablet (81 mg total) by mouth daily. Swallow whole.   atorvastatin  (LIPITOR) 20 MG tablet TAKE 1 TABLET(20 MG) BY MOUTH DAILY (Patient taking differently: Take 20 mg by mouth every morning. TAKE 1 TABLET(20 MG) BY MOUTH DAILY)   Cholecalciferol (VITAMIN D3) 10 MCG (400 UNIT) CHEW Chew 1 tablet by mouth daily at 6 (six) AM.   donepezil (ARICEPT) 10 MG tablet Take 1 tablet by mouth at bedtime.   feeding supplement (ENSURE ENLIVE / ENSURE PLUS) LIQD Take 237 mLs by mouth 3 (three) times daily.   gabapentin  (NEURONTIN ) 100 MG capsule Take 100 mg by mouth 2 (two) times daily.   levothyroxine  (SYNTHROID ) 100 MCG tablet Take 100 mcg by mouth daily before breakfast.   mirtazapine  (REMERON ) 15 MG tablet Take 1 tablet by mouth at bedtime.    Allergies:   Patient has no known allergies.   Social History   Socioeconomic History   Marital status: Married    Spouse name: Not on file   Number of children: Not on file   Years of education: Not on file   Highest education level: Not on file  Occupational History   Not on file  Tobacco Use   Smoking status: Never    Passive exposure: Never   Smokeless tobacco: Never  Vaping Use   Vaping status: Never Used  Substance and Sexual Activity   Alcohol use: No    Alcohol/week: 0.0 standard drinks of alcohol   Drug use: No   Sexual activity: Yes  Other Topics Concern   Not on file  Social History Narrative   Not on file   Social Drivers of Health   Financial Resource Strain: Medium Risk (07/14/2023)   Received from [facility]   Overall Financial Resource Strain (CARDIA)    Difficulty of Paying Living Expenses: Somewhat hard  Food Insecurity: No Food Insecurity (07/14/2023)   Received from [facility]   Hunger Vital Sign    Worried About Running Out of Food in the Last Year: Never true    Ran Out of Food in the Last Year: Never true  Transportation Needs: No  Transportation Needs (07/14/2023)   Received from [facility] - Transportation    In the past 12 months, has lack of transportation kept you from medical appointments or from getting medications?: No    Lack of Transportation (Non-Medical): No  Physical Activity: Not on file  Stress: Not on file  Social Connections: Not on file     Family History:  The patient's family history includes Heart disease in his mother; Prostate cancer in his brother. There is no history of Bladder Cancer or Kidney cancer.  ROS:   12-point review of systems is negative unless otherwise noted in the HPI.   EKGs/Labs/Other Studies Reviewed:    EKG:  EKG personally reviewed by me today EKG Interpretation Date/Time:  Friday Jan 02 2024 14:33:21 EDT Ventricular Rate:  71 PR Interval:  112  QRS Duration:  96 QT Interval:  372 QTC Calculation: 404 R Axis:   -80  Text Interpretation: Sinus rhythm with marked sinus arrhythmia Non-specific change in ST segment in V3-V5 Confirmed by Gildardo Labrador ([id number]) on 01/02/2024 2:44:19 PM  Recent Labs: 03/04/2023: ALT 12; BUN 35; Creatinine, Ser 2.16; Hemoglobin 11.3; Platelets 164; Potassium 4.4; Sodium 141  Recent Lipid Panel    Component Value Date/Time   CHOL 214 (H) 02/02/2021 1437   CHOL 177 12/20/2020 1045   TRIG 80 02/02/2021 1437   HDL 74 02/02/2021 1437   HDL 70 12/20/2020 1045   CHOLHDL 2.9 02/02/2021 1437   VLDL 16 02/02/2021 1437   LDLCALC 124 (H) 02/02/2021 1437   LDLCALC 96 12/20/2020 1045    PHYSICAL EXAM:    VS:  BP 118/65   Ht 5\' 10"  (1.778 m)   Wt 166 lb 9.6 oz (75.6 kg)   SpO2 98%   BMI 23.90 kg/m   BMI: Body mass index is 23.9 kg/m.  Physical Exam Vitals and nursing note reviewed.  Constitutional:      Appearance: Normal appearance.  Cardiovascular:     Rate and Rhythm: Normal rate and regular rhythm.     Heart sounds: No murmur heard.    No gallop.  Pulmonary:     Effort: Pulmonary effort is normal.      Breath sounds: Normal breath sounds. No wheezing or rales.  Musculoskeletal:     Right lower leg: No edema.     Left lower leg: No edema.  Skin:    General: Skin is warm and dry.  Neurological:     General: No focal deficit present.     Mental Status: He is alert and oriented to person, place, and time. Mental status is at baseline.  Psychiatric:        Mood and Affect: Mood normal.        Behavior: Behavior normal.     Wt Readings from Last 3 Encounters:  01/02/24 166 lb 9.6 oz (75.6 kg)  04/08/23 158 lb (71.7 kg)  03/28/23 158 lb 6.4 oz (71.8 kg)         ASSESSMENT & PLAN:   Coronary artery disease - Without symptoms of angina or cardiac decompensation. No indication for further ischemic evaluation given he is asymptomatic. Continue ASA and statin in the setting of single vessel CAD.   Atrial fibrillation - History of isolated episode of atrial fibrillation in 2008. Maintaining sinus rhythm on EKG today. DOAC deferred given lack of recurrence and recurrent falls.   Hypertension - BP well controlled with lifestyle  Leg swelling - Resolved since prior visit  Right elbow swelling - No pain in the joint. No warmth or other signs of infection. Suspect bursitis secondary to recent fall. Recommended using cold compress and wrapping with ACE bandage. Follow up with PCP if no resolution in the next 1-2 weeks.      Disposition: F/u with Dr. Nolan Battle or an APP in 12 months.   Medication Adjustments/Labs and Tests Ordered: Current medicines are reviewed at length with the patient today.  Concerns regarding medicines are outlined above. Medication changes, Labs and Tests ordered today are summarized above and listed in the Patient Instructions accessible in Encounters.   Beather Liming, PA-C 01/02/2024 4:41 PM     [facility] [place] [address] [place], [place] [address] [phone]

## 2024-01-02 NOTE — Patient Instructions (Signed)
 Medication Instructions:  Your Physician recommend you continue on your current medication as directed.    *If you need a refill on your cardiac medications before your next appointment, please call your pharmacy*  Lab Work: No labs ordered today  If you have labs (blood work) drawn today and your tests are completely normal, you will receive your results only by: MyChart Message (if you have MyChart) OR A paper copy in the mail If you have any lab test that is abnormal or we need to change your treatment, we will call you to review the results.  Testing/Procedures: No test ordered today   Follow-Up: At [facility], you and your health needs are our priority.  As part of our continuing mission to provide you with exceptional heart care, our providers are all part of one team.  This team includes your primary Cardiologist (physician) and Advanced Practice Providers or APPs (Physician Assistants and Nurse Practitioners) who all work together to provide you with the care you need, when you need it.  Your next appointment:   1 year(s)  Provider:   You may see Sammy Crisp, MD or one of the following Advanced Practice Providers on your designated Care Team:   Laneta Pintos, NP Gildardo Labrador, PA-C Varney Gentleman, PA-C Cadence Pennsburg, PA-C Ronald Cockayne, NP Morey Ar, NP    We recommend signing up for the patient portal called "MyChart".  Sign up information is provided on this After Visit Summary.  MyChart is used to connect with patients for Virtual Visits (Telemedicine).  Patients are able to view lab/test results, encounter notes, upcoming appointments, etc.  Non-urgent messages can be sent to your provider as well.   To learn more about what you can do with MyChart, go to [email or web address].

## 2024-03-09 ENCOUNTER — Other Ambulatory Visit: Payer: Self-pay

## 2024-03-09 DIAGNOSIS — N401 Enlarged prostate with lower urinary tract symptoms: Secondary | ICD-10-CM

## 2024-04-15 ENCOUNTER — Telehealth: Payer: Self-pay

## 2024-04-15 NOTE — Telephone Encounter (Signed)
 RX denied, needs appt
